# Patient Record
Sex: Female | Born: 1940
Health system: Southern US, Community
[De-identification: ages and names within clinical notes are randomized; demographics above are authoritative.]

## PROBLEM LIST (undated history)

## (undated) DIAGNOSIS — K219 Gastro-esophageal reflux disease without esophagitis: Secondary | ICD-10-CM

## (undated) DIAGNOSIS — F32A Depression, unspecified: Secondary | ICD-10-CM

## (undated) DIAGNOSIS — M1990 Unspecified osteoarthritis, unspecified site: Secondary | ICD-10-CM

## (undated) DIAGNOSIS — F329 Major depressive disorder, single episode, unspecified: Secondary | ICD-10-CM

## (undated) DIAGNOSIS — G43909 Migraine, unspecified, not intractable, without status migrainosus: Secondary | ICD-10-CM

## (undated) DIAGNOSIS — J309 Allergic rhinitis, unspecified: Secondary | ICD-10-CM

## (undated) DIAGNOSIS — H35379 Puckering of macula, unspecified eye: Secondary | ICD-10-CM

## (undated) DIAGNOSIS — K649 Unspecified hemorrhoids: Secondary | ICD-10-CM

## (undated) DIAGNOSIS — Z86010 Personal history of colonic polyps: Secondary | ICD-10-CM

## (undated) DIAGNOSIS — E039 Hypothyroidism, unspecified: Secondary | ICD-10-CM

## (undated) DIAGNOSIS — H269 Unspecified cataract: Secondary | ICD-10-CM

## (undated) DIAGNOSIS — L439 Lichen planus, unspecified: Secondary | ICD-10-CM

## (undated) HISTORY — DX: Unspecified osteoarthritis, unspecified site: M19.90

## (undated) HISTORY — PX: COLONOSCOPY: SHX174

## (undated) HISTORY — DX: Personal history of colonic polyps: Z86.010

## (undated) HISTORY — PX: THYROID SURGERY: SHX805

## (undated) HISTORY — DX: Hypothyroidism, unspecified: E03.9

## (undated) HISTORY — DX: Unspecified cataract: H26.9

## (undated) HISTORY — DX: Allergic rhinitis, unspecified: J30.9

## (undated) HISTORY — DX: Migraine, unspecified, not intractable, without status migrainosus: G43.909

## (undated) HISTORY — DX: Depression, unspecified: F32.A

## (undated) HISTORY — DX: Gastro-esophageal reflux disease without esophagitis: K21.9

## (undated) HISTORY — DX: Major depressive disorder, single episode, unspecified: F32.9

## (undated) HISTORY — DX: Puckering of macula, unspecified eye: H35.379

## (undated) HISTORY — DX: Lichen planus, unspecified: L43.9

## (undated) HISTORY — DX: Unspecified hemorrhoids: K64.9

## (undated) HISTORY — PX: OTHER SURGICAL HISTORY: SHX169

## (undated) HISTORY — PX: CATARACT EXTRACTION: SUR2

---

## 1999-02-23 ENCOUNTER — Other Ambulatory Visit: Admission: RE | Admit: 1999-02-23 | Discharge: 1999-02-23 | Payer: Self-pay | Admitting: *Deleted

## 1999-03-21 ENCOUNTER — Ambulatory Visit (HOSPITAL_COMMUNITY): Admission: RE | Admit: 1999-03-21 | Discharge: 1999-03-21 | Payer: Self-pay | Admitting: *Deleted

## 1999-03-21 ENCOUNTER — Encounter: Payer: Self-pay | Admitting: *Deleted

## 2000-10-25 ENCOUNTER — Encounter: Payer: Self-pay | Admitting: Family Medicine

## 2000-10-25 ENCOUNTER — Encounter: Admission: RE | Admit: 2000-10-25 | Discharge: 2000-10-25 | Payer: Self-pay | Admitting: Family Medicine

## 2001-10-28 ENCOUNTER — Other Ambulatory Visit: Admission: RE | Admit: 2001-10-28 | Discharge: 2001-10-28 | Payer: Self-pay | Admitting: Family Medicine

## 2002-04-30 ENCOUNTER — Encounter: Payer: Self-pay | Admitting: Family Medicine

## 2002-04-30 ENCOUNTER — Encounter: Admission: RE | Admit: 2002-04-30 | Discharge: 2002-04-30 | Payer: Self-pay | Admitting: Family Medicine

## 2005-02-22 ENCOUNTER — Encounter: Admission: RE | Admit: 2005-02-22 | Discharge: 2005-02-22 | Payer: Self-pay | Admitting: Family Medicine

## 2005-05-09 ENCOUNTER — Encounter (INDEPENDENT_AMBULATORY_CARE_PROVIDER_SITE_OTHER): Payer: Self-pay | Admitting: *Deleted

## 2005-05-09 ENCOUNTER — Ambulatory Visit (HOSPITAL_COMMUNITY): Admission: RE | Admit: 2005-05-09 | Discharge: 2005-05-09 | Payer: Self-pay | Admitting: Obstetrics and Gynecology

## 2005-11-02 ENCOUNTER — Encounter: Admission: RE | Admit: 2005-11-02 | Discharge: 2005-11-02 | Payer: Self-pay | Admitting: Family Medicine

## 2005-11-21 ENCOUNTER — Ambulatory Visit (HOSPITAL_COMMUNITY): Admission: RE | Admit: 2005-11-21 | Discharge: 2005-11-21 | Payer: Self-pay | Admitting: Ophthalmology

## 2008-05-07 ENCOUNTER — Encounter: Admission: RE | Admit: 2008-05-07 | Discharge: 2008-05-07 | Payer: Self-pay | Admitting: Family Medicine

## 2009-02-25 ENCOUNTER — Encounter: Payer: Self-pay | Admitting: Internal Medicine

## 2009-04-21 ENCOUNTER — Encounter: Payer: Self-pay | Admitting: Internal Medicine

## 2009-05-06 ENCOUNTER — Encounter: Payer: Self-pay | Admitting: Internal Medicine

## 2009-05-18 ENCOUNTER — Ambulatory Visit: Payer: Self-pay | Admitting: Internal Medicine

## 2009-06-01 ENCOUNTER — Ambulatory Visit: Payer: Self-pay | Admitting: Internal Medicine

## 2009-08-01 ENCOUNTER — Emergency Department (HOSPITAL_COMMUNITY): Admission: EM | Admit: 2009-08-01 | Discharge: 2009-08-01 | Payer: Self-pay | Admitting: Emergency Medicine

## 2011-05-12 NOTE — Op Note (Signed)
NAMESAYLAH, KETNER NO.:  [id number]   MEDICAL RECORD NO.:  [id number]          PATIENT TYPE:  AMB   LOCATION:  SDC                           FACILITY:  [facility]   PHYSICIAN:  Randye Lobo, M.D.   DATE OF BIRTH:  Apr 30, 1941   DATE OF PROCEDURE:  05/09/2005  DATE OF DISCHARGE:                                 OPERATIVE REPORT   PREOPERATIVE DIAGNOSES:  1.  Postmenopausal bleeding.  2.  Endometrial polyp.   POSTOPERATIVE DIAGNOSES:  1.  Postmenopausal bleeding.  2.  Endometrial polyp.   PROCEDURE:  Hysteroscopy, dilation and curettage.   SURGEON:  Conley Simmonds, MD   ANESTHESIA:  General, paracervical block with 1% lidocaine.   IV FLUIDS:  1000 mL Ringer's lactate.   ESTIMATED BLOOD LOSS:  Minimal.   URINE OUTPUT:  Per I&O catheterization prior to procedure.   COMPLICATIONS:  None.   INDICATIONS FOR SURGERY:  The patient is a 70 year old Caucasian female who  presented with a pelvic ultrasound showing a thickened endometrium near the  fundal region.  The remainder of the patient's pelvic ultrasound was  unremarkable.  During the course of her evaluation process, the patient did  present to the office with an episode of postmenopausal bleeding and  underwent an endometrial biopsy which documented an endometrial polyp.  The  patient was discontinued from a course of hormone therapy that had been  recently initiated.   The patient was diagnosed with an endometrial polyp, and a recommendation  was made to proceed with a hysteroscopy with dilation and curettage and  polypectomy after risks, benefits and alternatives were discussed with her.   FINDINGS:  Exam under anesthesia revealed a small, anteverted, mobile  uterus.  No adnexal masses were appreciated.   Hysteroscopy demonstrated a 4 mm of left fundal sessile endometrial polypoid  area.  There was also a 3-4 mm area along the anterior fundal wall which was  thought to be possibly consistent with a  small fibroid versus calcified  area.  No other lesions were appreciated within the uterine cavity or the  cervix.   SPECIMENS:  Endometrial curettings were sent to pathology.   PROCEDURE:  The patient was reidentified in the preoperative hold area and  was then taken to the operating room, where general endotracheal anesthesia  was induced.  The patient was placed in the dorsal lithotomy position and  the vagina and perineum were sterilely prepped and draped.  The bladder was  I&O catheterized.  An exam under anesthesia was performed.   The procedure began by placing a speculum inside the vagina and a tenaculum  on the anterior cervical lip.  A paracervical block was performed with 10 mL  of 1% lidocaine in standard fashion.  The cervix was dilated to a #19 Pratt  dilator.  The diagnostic hysteroscope was inserted under the continuous  infusion of sorbitol, and the findings were as noted above.  The  hysteroscope was withdrawn and the cervix was dilated slightly more, and a  combination of sharp and serrated curettes were then used to curette the  endometrial cavity  in all four quadrants.  The operative hysteroscope was  then placed through the cervix and into the endometrial cavity after the  cervix was dilated to a #25 Pratt dilator.  The visualization was not  adequate at this time, and this was therefore removed and the diagnostic  hysteroscope was reinserted again under the infusion of sorbitol.  There was  a small polypoid area which was still present along the patient's left  fundal wall, and the hysteroscope was therefore removed and the area was  again curetted in this area.  All specimens were sent to pathology, and  final hysteroscopy demonstrated the absence of endometrial polyps in the  uterine cavity at this time.   All of the instruments were removed, and hemostasis was noted to be good.  The patient was therefore awakened and extubated and taken out of the dorsal   lithotomy position.  She was escorted to the recovery room in stable and  awake condition.  There were no complications for the procedure.  All  needle, instrument, sponge counts were correct.       BES/MEDQ  D:  05/09/2005  T:  05/09/2005  Job:  [id number]

## 2011-05-12 NOTE — Op Note (Signed)
[name], [name] NO.:  [id number]   MEDICAL RECORD NO.:  [id number]          PATIENT TYPE:  AMB   LOCATION:  SDS                          FACILITY:  [facility]   PHYSICIAN:  [name], M.D.   DATE OF BIRTH:  Dec 20, 1941   DATE OF PROCEDURE:  11/21/2005  DATE OF DISCHARGE:                                 OPERATIVE REPORT   PREOPERATIVE DIAGNOSIS:  Epiretinal membrane, left eye, severe.   POSTOPERATIVE DIAGNOSES:  1.  Epiretinal membrane, left eye, severe.  2.  Extramacular branch retinal vein occlusion, left eye temporally.  3.  Retinal hole found temporal to the macula with peeling of two layers of      epiretinal membrane.   PROCEDURES:  1.  Posterior vitrectomy with membrane peel, left eye, epiretinal tissue as      well as internal limiting membrane, left eye.  2.  Pars plana vitrectomy with focal laser photocoagulation for retinal      hole, each 25-gauge.   SURGEON:  Molly Highland. Byrd, M.D.   ANESTHESIA:  Local retrobulbar with monitored anesthesia control.   INDICATION FOR PROCEDURE:  The patient is a 70 year old woman with profound  vision loss on the basis of epiretinal membrane and macular topographic  distortion of the left eye.  The patient understands this is an attempt to  release the macular traction so as to allow enhanced visual acuity and  improvement in visual acuity.  The patient understands the risks of  anesthesia including the rare occurrence of death and losses to the eye,  including but not limited to hemorrhage, infection, scarring, need for  further surgery, no change in vision, loss of vision, and progression of  disease despite intervention.  An appropriate signed consent was obtained.   The patient taken to the operating room.  In the operating room appropriate  monitoring was followed by mild sedation.  Marcaine 0.75% delivered 5 mL  retrobulbar followed by an additional 5 mL in the left eye in a modified [employer] fashion.   The left periocular region was sterilely prepped and draped  in the usual ophthalmic fashion.  Microscope placed in position.  Shelved  trocar incisions for 25-gauge vitrectomy were then placed in a biplanar  fashion.  The infusion was confirmed in the vitreous cavity.  The superior  trocar was placed in a similar fashion.  A 25-gauge core vitrectomy was then  begun.  Posterior hyaloid was identified with a spontaneous Weiss ring.  Peripheral vitreous was then trimmed 360 degrees.  Notable findings were of  areas of retinal nonperfusion at the 3 o'clock position in the far temporal  periphery consistent with an Extramacular branch retinal vein occlusion.  At  this time there were no retinal holes or tears peripherally.  The epiretinal  membrane was then grasped with a 25-gauge forceps.  Peeling of the first  retinal membrane created traction along the superotemporal macular arcade  and the are of thin retina with possible retinal hole was fashioned.  The  internal limiting membrane was then grasped in this area and this was  removed in a contiguous fashion off the foveal macular region.   At this time focal laser photocoagulation was placed around the apparent  retinal hole in the temporal macula as well as in the area of Extramacular  branch retinal vein occlusion temporally in the far temporal periphery.   At this time under high infusion pressures, the trocars were removed and  scleral compression was then used and, indeed, no egress of fluid into the  subconjunctival space was noted in all three quadrants.  Excellent pressure  was maintained.  Subconjunctival Decadron was applied.  A sterile patch and  Fox shield applied.  The patient taken to the short stay area, to be  discharged home as an outpatient.      [name], M.D.  Electronically Signed     GAR/MEDQ  D:  11/21/2005  T:  11/21/2005  Job:  [id number]   cc:   [name]. [name], M.D.  Fax: [phone]

## 2011-12-27 DIAGNOSIS — H1030 Unspecified acute conjunctivitis, unspecified eye: Secondary | ICD-10-CM | POA: Diagnosis not present

## 2011-12-27 DIAGNOSIS — H35379 Puckering of macula, unspecified eye: Secondary | ICD-10-CM | POA: Diagnosis not present

## 2012-01-18 DIAGNOSIS — H35379 Puckering of macula, unspecified eye: Secondary | ICD-10-CM | POA: Diagnosis not present

## 2012-01-19 DIAGNOSIS — L253 Unspecified contact dermatitis due to other chemical products: Secondary | ICD-10-CM | POA: Diagnosis not present

## 2012-01-19 DIAGNOSIS — L940 Localized scleroderma [morphea]: Secondary | ICD-10-CM | POA: Diagnosis not present

## 2012-02-22 DIAGNOSIS — Z2089 Contact with and (suspected) exposure to other communicable diseases: Secondary | ICD-10-CM | POA: Diagnosis not present

## 2012-02-22 DIAGNOSIS — Z0000 Encounter for general adult medical examination without abnormal findings: Secondary | ICD-10-CM | POA: Diagnosis not present

## 2012-03-21 DIAGNOSIS — H35379 Puckering of macula, unspecified eye: Secondary | ICD-10-CM | POA: Diagnosis not present

## 2012-03-21 DIAGNOSIS — H43819 Vitreous degeneration, unspecified eye: Secondary | ICD-10-CM | POA: Diagnosis not present

## 2012-03-21 DIAGNOSIS — H3581 Retinal edema: Secondary | ICD-10-CM | POA: Diagnosis not present

## 2012-04-03 ENCOUNTER — Encounter (HOSPITAL_COMMUNITY): Payer: Self-pay | Admitting: Surgery

## 2012-04-03 ENCOUNTER — Ambulatory Visit (HOSPITAL_COMMUNITY)
Admission: RE | Admit: 2012-04-03 | Discharge: 2012-04-03 | Disposition: A | Discharge status: Home / Self Care | Payer: Medicare Other | Source: Ambulatory Visit | Attending: Internal Medicine | Admitting: Internal Medicine

## 2012-04-03 ENCOUNTER — Other Ambulatory Visit (HOSPITAL_COMMUNITY): Payer: Self-pay | Admitting: Internal Medicine

## 2012-04-03 ENCOUNTER — Inpatient Hospital Stay (HOSPITAL_COMMUNITY)
Admission: AD | Admit: 2012-04-03 | Discharge: 2012-04-05 | DRG: 419 | Disposition: A | Discharge status: Home / Self Care | Payer: Medicare Other | Source: Ambulatory Visit | Attending: General Surgery | Admitting: General Surgery

## 2012-04-03 DIAGNOSIS — Z1231 Encounter for screening mammogram for malignant neoplasm of breast: Secondary | ICD-10-CM | POA: Diagnosis not present

## 2012-04-03 DIAGNOSIS — R109 Unspecified abdominal pain: Secondary | ICD-10-CM | POA: Diagnosis not present

## 2012-04-03 DIAGNOSIS — E039 Hypothyroidism, unspecified: Secondary | ICD-10-CM | POA: Diagnosis not present

## 2012-04-03 DIAGNOSIS — K8020 Calculus of gallbladder without cholecystitis without obstruction: Secondary | ICD-10-CM | POA: Diagnosis not present

## 2012-04-03 DIAGNOSIS — K8000 Calculus of gallbladder with acute cholecystitis without obstruction: Secondary | ICD-10-CM | POA: Diagnosis not present

## 2012-04-03 DIAGNOSIS — R82998 Other abnormal findings in urine: Secondary | ICD-10-CM | POA: Diagnosis not present

## 2012-04-03 DIAGNOSIS — K828 Other specified diseases of gallbladder: Secondary | ICD-10-CM | POA: Diagnosis not present

## 2012-04-03 DIAGNOSIS — R071 Chest pain on breathing: Secondary | ICD-10-CM | POA: Diagnosis not present

## 2012-04-03 DIAGNOSIS — J9819 Other pulmonary collapse: Secondary | ICD-10-CM | POA: Diagnosis not present

## 2012-04-03 DIAGNOSIS — K219 Gastro-esophageal reflux disease without esophagitis: Secondary | ICD-10-CM | POA: Diagnosis not present

## 2012-04-03 DIAGNOSIS — I2699 Other pulmonary embolism without acute cor pulmonale: Secondary | ICD-10-CM

## 2012-04-03 DIAGNOSIS — R079 Chest pain, unspecified: Secondary | ICD-10-CM | POA: Diagnosis not present

## 2012-04-03 DIAGNOSIS — R030 Elevated blood-pressure reading, without diagnosis of hypertension: Secondary | ICD-10-CM | POA: Diagnosis not present

## 2012-04-03 DIAGNOSIS — K8010 Calculus of gallbladder with chronic cholecystitis without obstruction: Secondary | ICD-10-CM | POA: Diagnosis not present

## 2012-04-03 DIAGNOSIS — K810 Acute cholecystitis: Secondary | ICD-10-CM | POA: Diagnosis not present

## 2012-04-03 DIAGNOSIS — R072 Precordial pain: Secondary | ICD-10-CM | POA: Diagnosis not present

## 2012-04-03 MED ORDER — SODIUM CHLORIDE 0.9 % IV SOLN
3.0000 g | Freq: Four times a day (QID) | INTRAVENOUS | Status: DC
  Administered 2012-04-04 (×4): 3 g via INTRAVENOUS
  Filled 2012-04-03 (×7): qty 3

## 2012-04-03 MED ORDER — HYDROMORPHONE HCL PF 1 MG/ML IJ SOLN
1.0000 mg | INTRAMUSCULAR | Status: DC | PRN
  Administered 2012-04-04: 1 mg via INTRAVENOUS
  Filled 2012-04-03: qty 1

## 2012-04-03 MED ORDER — ENOXAPARIN SODIUM 40 MG/0.4ML ~~LOC~~ SOLN
40.0000 mg | SUBCUTANEOUS | Status: DC
  Filled 2012-04-03: qty 0.4

## 2012-04-03 MED ORDER — KCL IN DEXTROSE-NACL 10-5-0.45 MEQ/L-%-% IV SOLN
INTRAVENOUS | Status: DC
  Administered 2012-04-04 – 2012-04-05 (×2): via INTRAVENOUS
  Filled 2012-04-03 (×7): qty 1000

## 2012-04-03 MED ORDER — IOHEXOL 350 MG/ML SOLN
100.0000 mL | Freq: Once | INTRAVENOUS | Status: AC | PRN
  Administered 2012-04-03: 100 mL via INTRAVENOUS

## 2012-04-03 MED ORDER — ONDANSETRON HCL 4 MG/2ML IJ SOLN: 4.0000 mg | Freq: Four times a day (QID) | INTRAMUSCULAR | Status: DC | PRN

## 2012-04-03 MED ORDER — IOHEXOL 300 MG/ML  SOLN
40.0000 mL | Freq: Once | INTRAMUSCULAR | Status: AC | PRN
  Administered 2012-04-03: 40 mL via ORAL

## 2012-04-03 NOTE — Progress Notes (Signed)
Called by Radiology regarding CT results consistent with Acute Cholecystitis- discussed with Dr. Luisa Hart ([facility]) who will admit patient to surgical service.  Below is Dr. Vicente Males note from office visit earlier today and lab results:  Vital Signs  Entered weight:  153.8  lbs., Calculated Weight: 153.80 lbs., ( 69.76 kg) Height: 64 in., ( 162.56 cm) Pulse rate: 100 Pulse rhythm: regular  Blood Pressure #1: 128 / 78 mm Hg    BMI: 26.40 Wt Chg: 1.80 lbs since 06/13/2011  Vitals entered by: Drema Pry CMA on April 03, 2012 3:30 PM  Pulse Oximetry  O2 Saturation: 94 %  Other Procedures Completed: EKG   Risk Factors:  Tobacco use:  never smoker Passive smoke exposure:  yes Drug use:  no HIV high-risk behavior:  no Caffeine use:  2 drinks per day Alcohol use:  no Exercise:  no Seatbelt use:  100 % Sun Exposure:  occasionally  Colonoscopy History:    Date of Last Colonoscopy:  06/01/2009  Mammogram History:    Date of Last Mammogram:  02/14/2011  PAP Smear History:    Date of Last PAP Smear:  06/13/2011  History of Present Illness  Reason for visit: See chief complaint Chief Complaint: pt c/o chest pressure the last three days, constant,  pt denies any shortness of breath,   History of Present Illness: Patient presents as a work and office visit with a three-day four-day history of worsening chest discomfort, without shortness of breath, states that however when she was trying to walk half a mile yesterday did have to stop secondary to discomfort. She has been off of her acid blockers, Prilosec, states that her discomfort is pleuritic, denies any fevers or chills, associates this discomfort and pain in the sternal area, radiating into the epigastric area and around to the right upper quadrant of the abdomen. Denies any significant nausea vomiting and has no effective eating with her discomfort. Denies any fevers, chills, changes in bowel habits, blood in her urine or stool, specifically  denies dysuria.   Review of Systems  General: No fevers, chills, general malaise, denies headaches, sweats, anorexia Eyes: Denies blurring, diplopia, irritation, discharge, vision loss, eye pain, photophobia.   Ear/Nose/Throat: denies ear pain or discharge, tinnitus, decreased hearing, nasal obstruction or discharge, nosebleeds, sore throat, hoarseness, dysphagia Cardiovascular: See HPI, substernal chest discomfort, pleuritic, with radiation to the epigastric area, right upper quadrant of the abdomen, without nausea, vomiting Respiratory: Denies any shortness of breath, but does admit to pleuritic discomfort, denies wheezing, cough, hemoptysis we'll sputum production Gastrointestinal: Denies nausea, vomiting, diarrhea, constipation or significant change in bowel habits Genitourinary: Denies dysuria, frequency or hematuria Musculoskeletal: Admits to arthritis and general myalgias as well as sternal pain as above Skin: Denies rash, itching Neurologic: Denies radicular symptoms, headaches Psychiatric: Spirits stable, denies any exacerbations Endocrine: Denies cold intolerance, heat intolerance, polydipsia, polyphagia, polyuria, weight change.   Hematologic/Lymphatic: Denies abnormal bruising, bleeding, enlarged lymph nodes.    Past History Past Medical History (reviewed - no changes required): Hypothyroidism status post remote thyroid surgery Previous physician Dr. Mosetta Putt Family history premature coronary artery and diabetes Hypothyroidism Migraine headaches Effexor XR 75 mg a day, for depression Lamisil 250 mg each day for nail fungus GYN: Dr. Duard Larsen  Cataracts Dr. Dagoberto Ligas retina Wrinkle with surgery by Dr. Luciana Axe GI: Dr Leone Payor Colonscopy 2010 "ok" DEXA 2011-nl lichen planus followed by Dr. Emily Filbert SAR Surgical History (reviewed - no changes required): History thyroid surgery 2000? cared for Dr. Evlyn Kanner; no  cancer Cataracts Dr. Dagoberto Ligas retina Wrinkle with surgery  by Dr. 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[employer], high school education, married (1988), no history of tobacco or alcohol use, walks 30 minutes each day; native of [place]   Physical Exam  General appearance: well nourished, well hydrated, no acute distress  Eyes  External: conjunctivae and lids normal Pupils: equal, round, reactive to light and accommodation  Ears, Nose and Throat  External ears: normal, no lesions or deformities External nose: normal, no lesions or deformities Otoscopic: canals clear, tympanic membranes intact, no fluid Hearing: grossly intact Nasal: mucosa, septum, and turbinates normal Dental: good dentition Pharynx: tongue normal, protrudes mid line,  posterior pharynx without erythema or exudate  Neck  Neck: supple, no masses, trachea midline Thyroid: no nodules, masses, tenderness, or enlargement  Respiratory  Respiratory effort: no intercostal retractions or use of accessory muscles Auscultation: no rales, rhonchi, or wheezes  Cardiovascular  Palpation: minimally tender to palpation over the sternal area but very tender as noted below in the epigastric area Auscultation: S1, S2, no murmur, rub, or gallop Carotid arteries: pulses 2+, symmetric, no bruits Abdominal aorta: no enlargement or bruits Pedal pulses: pulses 2+, symmetric Periph. circulation: no cyanosis, clubbing, edema  Gastrointestinal  Abdomen: nondistended, bowel sounds present, moderately tender in the epigastric area radiating over to the right upper quadrant and right mid section,  no rash Liver and spleen: no enlargement or nodularity  Lymphatic  Neck: no cervical adenopathy Axillae: no axillary adenopathy  Musculoskeletal  Gait and station: normal Digits and nails: no clubbing, cyanosis, petechiae, or nodes Head and neck: normal alignment and mobility Spine, ribs, pelvis: normal alignment and mobility, no deformity  Skin  Inspection: no rashes, lesions Palpation: no subcutaneous nodules or induration  Neurologic  Cranial nerves: II - XII grossly intact Reflexes: 2+, symmetric Sensation: intact to touch, vibration  Mental Status Exam  Judgment, insight: intact Orientation: oriented to time, place, and person Memory: intact Mood and affect: Normal Mood  Assessment and Plan  Problem # 1:      CHEST PAIN - PLEURITIC (ZOX-096.04) - New Problem   Unclear etiology, chest x-ray unremarkable, oxygen saturation initially 90% on room air increasing to 94% with time, exam unremarkable however patient is somewhat tender over the sternal area as well as the epigastric area, blood counts are normal with no evidence of leukocytosis or anemia, renal parameters and electrolytes as well as liver function tests are normal. Pleuritic discomfort as well as inability to walk long distances as she normally has is concerning, we do need to rule out pulmonary embolism. We'll obtain CT angiogram to rule this out. Please note that EKG and chest x-ray both unremarkable awaiting formal radiological over read.  Problem # 2:      ABDOMINAL PAIN (ICD-789.00) - New Problem   Significant abdominal pain in the epigastric area as well as right upper quadrant, laboratory evaluation unremarkable however with urinalysis there is a trace amount of leukocytes and 2+ blood however microscopic only shows 0-5 white blood cell counts, 5-10 red blood cells with occasional epithelial cells no bacteria. Unlikely to be a source of infection  especially given her normal white count in the location of her  pain. Patient is afebrile, and patient has been back on her omeprazole since Sunday, 3 days. She is taking the omeprazole twice a day. We will also add an abdominal and pelvic CT scan with p.o. and IV contrast to further evaluation and management. We'll try to obtain these tonight with call report.  Updated Orders:  EKG 12 Leads [CPT-93000] Chest X-Ray (Pa & L) [CPT-71020] Venipuncture [CPT-36415] CBC/Platelets [CPT-85025] CMET (SMA 12) [CPT-80053] UA (Autmated) W/Micro [CPT-81001] Ofc Vst, Est Level V [CPT-99215] CT Abdomen and Pelvis W/Contrast [CTAPW] CT Angiography Chest-Pulmonary Embolus W/Contrast [CTACPE]  Electronically signed by Hoyle Sauer MD on 04/03/2012 at 5:22 PM  Lab Results- Tests: (1) URINALYSIS (4000)   PH                        5.0                         5.0-9.0   SPECIFIC GRAVITY          >=1.030                     1.000 - 1.030 ! GLUCOSE                   NEGATIVE                    NEGATIVE   UROBILINOGEN              1.0 E.U./dL mg/dL           0.2 mg/dL   KETONES                   * TRACE mg/dl               NEGATIVE   BLOOD                     * 2+                        NEGATIVE   NITRATE                   NEGATIVE                    NEGATIVE   LEUKOCYTES                * TRACE                     NEGATIVE   PROTEIN                   NEGATIVE mg/dl              NEGATIVE   BILIRUBIN                 NEGATIVE                    NEGATIVE  Tests: (2) CBC (2000)   WBC                       7.70 K/uL                   4.10-10.90   LYM  2.5 K/uL                    0.6-4.1 ! MID                       0.9 K/uL                    0.0-1.8   GRAN                      4.3 K/uL                    2.0-7.8   LYM%                      32.5 %                      10.0-58.5 ! MID%                      11.1 %                      0.1-24.0   GRAN%                     56.4                        37.0-92.0   RBC                  [L]  4.1 M/uL                     4.2-6.3     l=   HGB                       13.2 g/dL                   40.9-81.1   HCT                       39.0 %                      37.0-51.0   MCV                       94.9 fL                     80.0-97.0   MCH                  [H]  32.1 pg                     26.0-32.0     h=   MCHC                      33.8 g/dL                   91.4-78.2   PLT                       269 K/uL                    140-440  Tests: (3) COMPLETE METABOLIC (1010)  GLUCOSE              [H]  132 mg/dl                   96-045   BUN                       11 mg/dl                    4-09   CREATININE                0.9 mg/dl                   8.1-1.9  eGFR Non-African American                             61.7  eGFR African American                             74.7   SODIUM                    140 mEq/L                   135-148   POTASSIUM                 3.5 mEq/L                   3.5-5.3   CHLORIDE                  105 mEq/L                   80-111   CO2                       26 mEq/L                    15-35   CALCIUM                   9.4 mg/dL                   1.4-78.2   TOTAL PROTEIN             7.2 g/dL                    9.5-6.2   ALBUMIN                   3.2 g/dL                    1.3-0.8   AST                       27 IU/L                     7-45   ALT                       15 IU/L                     5-40   ALK PHOS  89 IU/L                     37-137   TOTAL BILIRUBIN           0.4 mg/dl                   1.6-1.0  Tests: (4) URINE MICROSCOPIC (7100)   WBC URINE                 0-5                         NEGATIVE   RBC URINE                 5-10                        NEGATIVE   CRYSTALS                  NONE                        NEGATIVE ! BACTERIA                  NEGATIVE                    TRACE   EPITHELIAL CELLS          OCC                         NEGATIVE   YEAST                     NEGATIVE                    NEGATIVE   MUCUS                      NEGATIVE                    LIGHT   CASTS                     NONE                        NEGATIVE   OTHER                     NONE

## 2012-04-03 NOTE — H&P (Signed)
[name] is an 71 y.o. female.   Chief Complaint: Chest pain HPI: 4 day history of epigastric and RUQ abdominal pain with nausea.  Worsening and sharp without radiation.  No vomiting but nausea present.  Sent over by Dr Felipa Eth for CT which shows cholecystitis.  No previous attacks. No appetite.  No food trigger.  No past medical history on file.  No past surgical history on file.  No family history on file. Social History:  does not have a smoking history on file. She does not have any smokeless tobacco history on file. Her alcohol and drug histories not on file.  Allergies: Not on File  Medications Prior to Admission  Medication Dose Route Frequency Provider Last Rate Last Dose  . iohexol (OMNIPAQUE) 300 MG/ML solution 40 mL  40 mL Oral Once PRN Medication Radiologist, MD   40 mL at 04/03/12 2053  . iohexol (OMNIPAQUE) 350 MG/ML injection 100 mL  100 mL Intravenous Once PRN Medication Radiologist, MD   100 mL at 04/03/12 2053   No current outpatient prescriptions on file as of 04/03/2012.    No results found for this or any previous visit (from the past 48 hour(s)). Ct Angio Chest W/cm &/or Wo Cm  04/03/2012  *RADIOLOGY REPORT*  Clinical Data:  Mid sternal chest pain and nausea.  Mid to right- sided abdominal pain.  CT ANGIOGRAPHY CHEST CT ABDOMEN AND PELVIS WITH CONTRAST  Technique:  Multidetector CT imaging of the chest was performed using the standard protocol during bolus administration of intravenous contrast.  Multiplanar CT image reconstructions including MIPs were obtained to evaluate the vascular anatomy. Multidetector CT imaging of the abdomen and pelvis was performed using the standard protocol during bolus administration of intravenous contrast.  Contrast: 100 mL of Omnipaque 300 IV contrast  Comparison:  Pelvic ultrasound performed 02/22/2005  CTA CHEST  Findings:  There is no evidence of pulmonary embolus.  Mild bilateral dependent subsegmental atelectasis is noted.  The  lungs are otherwise clear.  There is no evidence of significant focal consolidation, pleural effusion or pneumothorax.  No masses are identified; no abnormal focal contrast enhancement is seen.  The mediastinum is unremarkable in appearance.  There is no evidence of mediastinal lymphadenopathy.  No pericardial effusion is seen.  The great vessels are unremarkable in appearance. Scattered calcification is noted along the aortic arch.  No axillary lymphadenopathy is seen.  The visualized portions of the thyroid gland are unremarkable in appearance.  Scattered calcifications are noted within the left breast, within normal limits.  No acute osseous abnormalities are seen.   Review of the MIP images confirms the above findings.  IMPRESSION:  1.  No evidence of pulmonary embolus. 2.  Mild bilateral dependent subsegmental atelectasis noted; lungs otherwise clear.  CT ABDOMEN AND PELVIS  Findings: There is diffuse gallbladder wall thickening and a small amount of associated pericholecystic fluid, with surrounding soft tissue inflammation, compatible with acute cholecystitis. Scattered stones are seen at the fundus of the gallbladder, and a 9 mm stone is also noted at the gallbladder neck.  The gallbladder is mildly distended.  A 1.1 cm vague hypodensity within the left hepatic lobe is nonspecific in appearance; this could be further assessed on dynamic liver protocol MRI or CT, as deemed clinically appropriate. The liver is otherwise unremarkable in appearance.  The spleen is within normal limits.  The pancreas and adrenal glands are unremarkable.  The common hepatic duct and common bile duct remains grossly normal in caliber.  The kidneys are unremarkable in appearance.  There is no evidence of hydronephrosis.  No renal or ureteral stones are seen.  No perinephric stranding is appreciated.  The small bowel is unremarkable in appearance.  The stomach is within normal limits.  No acute vascular abnormalities are seen.  The  appendix is normal in caliber, without evidence for appendicitis.  Contrast progresses to the level of the descending colon; the colon is unremarkable in appearance.  The bladder is mildly distended and grossly unremarkable in appearance.  The uterus is within normal limits.  The ovaries are relatively symmetric; no suspicious adnexal masses are seen.  No inguinal lymphadenopathy is seen.  No acute osseous abnormalities are identified.  Mild vacuum phenomenon is noted at L3-L4.  Mild facet disease is noted along the lumbar spine.  Review of the MIP images confirms the above findings.  IMPRESSION:  1.  Acute cholecystitis, with diffuse gallbladder wall thickening and associated pericholecystic fluid, and surrounding soft tissue inflammation.  Scattered stones noted at the fundus of the gallbladder, and a 9 mm stone noted lodged at the gallbladder neck. 2.  No evidence of distal obstruction; the common hepatic duct and common bile duct remains normal in caliber. 3.  1.1 cm vague hypodensity within the left hepatic lobe, nonspecific in appearance; this could be further assessed on dynamic liver protocol MRI or CT, on an elective basis, when and as deemed clinically appropriate.  These results were called by telephone on 04/03/2012  at  09:13 p.m. to  the physician on call for Dr. Felipa Eth, who verbally acknowledged these results.  Original Report Authenticated By: Tonia Ghent, M.D.   Ct Abdomen Pelvis W Contrast  04/03/2012  *RADIOLOGY REPORT*  Clinical Data:  Mid sternal chest pain and nausea.  Mid to right- sided abdominal pain.  CT ANGIOGRAPHY CHEST CT ABDOMEN AND PELVIS WITH CONTRAST  Technique:  Multidetector CT imaging of the chest was performed using the standard protocol during bolus administration of intravenous contrast.  Multiplanar CT image reconstructions including MIPs were obtained to evaluate the vascular anatomy. Multidetector CT imaging of the abdomen and pelvis was performed using the standard  protocol during bolus administration of intravenous contrast.  Contrast: 100 mL of Omnipaque 300 IV contrast  Comparison:  Pelvic ultrasound performed 02/22/2005  CTA CHEST  Findings:  There is no evidence of pulmonary embolus.  Mild bilateral dependent subsegmental atelectasis is noted.  The lungs are otherwise clear.  There is no evidence of significant focal consolidation, pleural effusion or pneumothorax.  No masses are identified; no abnormal focal contrast enhancement is seen.  The mediastinum is unremarkable in appearance.  There is no evidence of mediastinal lymphadenopathy.  No pericardial effusion is seen.  The great vessels are unremarkable in appearance. Scattered calcification is noted along the aortic arch.  No axillary lymphadenopathy is seen.  The visualized portions of the thyroid gland are unremarkable in appearance.  Scattered calcifications are noted within the left breast, within normal limits.  No acute osseous abnormalities are seen.   Review of the MIP images confirms the above findings.  IMPRESSION:  1.  No evidence of pulmonary embolus. 2.  Mild bilateral dependent subsegmental atelectasis noted; lungs otherwise clear.  CT ABDOMEN AND PELVIS  Findings: There is diffuse gallbladder wall thickening and a small amount of associated pericholecystic fluid, with surrounding soft tissue inflammation, compatible with acute cholecystitis. Scattered stones are seen at the fundus of the gallbladder, and a 9 mm stone is also noted at the  gallbladder neck.  The gallbladder is mildly distended.  A 1.1 cm vague hypodensity within the left hepatic lobe is nonspecific in appearance; this could be further assessed on dynamic liver protocol MRI or CT, as deemed clinically appropriate. The liver is otherwise unremarkable in appearance.  The spleen is within normal limits.  The pancreas and adrenal glands are unremarkable.  The common hepatic duct and common bile duct remains grossly normal in caliber.  The  kidneys are unremarkable in appearance.  There is no evidence of hydronephrosis.  No renal or ureteral stones are seen.  No perinephric stranding is appreciated.  The small bowel is unremarkable in appearance.  The stomach is within normal limits.  No acute vascular abnormalities are seen.  The appendix is normal in caliber, without evidence for appendicitis.  Contrast progresses to the level of the descending colon; the colon is unremarkable in appearance.  The bladder is mildly distended and grossly unremarkable in appearance.  The uterus is within normal limits.  The ovaries are relatively symmetric; no suspicious adnexal masses are seen.  No inguinal lymphadenopathy is seen.  No acute osseous abnormalities are identified.  Mild vacuum phenomenon is noted at L3-L4.  Mild facet disease is noted along the lumbar spine.  Review of the MIP images confirms the above findings.  IMPRESSION:  1.  Acute cholecystitis, with diffuse gallbladder wall thickening and associated pericholecystic fluid, and surrounding soft tissue inflammation.  Scattered stones noted at the fundus of the gallbladder, and a 9 mm stone noted lodged at the gallbladder neck. 2.  No evidence of distal obstruction; the common hepatic duct and common bile duct remains normal in caliber. 3.  1.1 cm vague hypodensity within the left hepatic lobe, nonspecific in appearance; this could be further assessed on dynamic liver protocol MRI or CT, on an elective basis, when and as deemed clinically appropriate.  These results were called by telephone on 04/03/2012  at  09:13 p.m. to  the physician on call for Dr. Felipa Eth, who verbally acknowledged these results.  Original Report Authenticated By: Tonia Ghent, M.D.    Review of Systems  Constitutional: Negative for fever and chills.  HENT: Negative.   Eyes: Negative.   Respiratory: Negative.   Cardiovascular: Negative.   Gastrointestinal: Negative.   Genitourinary: Negative.   Musculoskeletal: Negative.    Skin: Negative.   Neurological: Negative.   Endo/Heme/Allergies: Negative.   Psychiatric/Behavioral: Negative.     Blood pressure 127/70, pulse 92, temperature 98.4 F (36.9 C), temperature source Oral, resp. rate 20, height 5\' 4"  (1.626 m), weight 152 lb 5.4 oz (69.1 kg), SpO2 96.00%. Physical Exam  Constitutional: She is oriented to person, place, and time. She appears well-developed and well-nourished.  HENT:  Head: Atraumatic.  Eyes: EOM are normal. Pupils are equal, round, and reactive to light.  Neck: Normal range of motion. Neck supple. No JVD present.  Cardiovascular: Normal rate and regular rhythm.   Respiratory: Effort normal and breath sounds normal.  GI: She exhibits no distension. There is tenderness in the right upper quadrant. There is positive Murphy's sign. There is no CVA tenderness.  Neurological: She is alert and oriented to person, place, and time. GCS eye subscore is 4. GCS verbal subscore is 5. GCS motor subscore is 6.  Skin: Skin is warm, dry and intact.  Psychiatric: She has a normal mood and affect. Her speech is normal and behavior is normal. Thought content normal.     Assessment/Plan Acute cholecystitis. IVF Pain control IV  antibiotics Probable Lap chole/open chole Thursday per Dr Janee Morn.  Cosby Proby A. 04/03/2012, 10:42 PM

## 2012-04-04 ENCOUNTER — Inpatient Hospital Stay (HOSPITAL_COMMUNITY): Payer: Medicare Other | Admitting: Anesthesiology

## 2012-04-04 ENCOUNTER — Inpatient Hospital Stay (HOSPITAL_COMMUNITY): Payer: Medicare Other

## 2012-04-04 ENCOUNTER — Encounter (HOSPITAL_COMMUNITY): Payer: Self-pay | Admitting: Surgery

## 2012-04-04 ENCOUNTER — Encounter (HOSPITAL_COMMUNITY): Payer: Self-pay | Admitting: Anesthesiology

## 2012-04-04 ENCOUNTER — Encounter (HOSPITAL_COMMUNITY): Admission: AD | Disposition: A | Discharge status: Home / Self Care | Payer: Self-pay | Source: Ambulatory Visit

## 2012-04-04 DIAGNOSIS — K8000 Calculus of gallbladder with acute cholecystitis without obstruction: Secondary | ICD-10-CM | POA: Diagnosis not present

## 2012-04-04 HISTORY — PX: CHOLECYSTECTOMY: SHX55

## 2012-04-04 LAB — CBC
HCT: 36.1 % (ref 36.0–46.0)
MCHC: 32.7 g/dL (ref 30.0–36.0)
MCV: 93.8 fL (ref 78.0–100.0)
RDW: 12.7 % (ref 11.5–15.5)

## 2012-04-04 LAB — COMPREHENSIVE METABOLIC PANEL
Albumin: 2.9 g/dL — ABNORMAL LOW (ref 3.5–5.2)
CO2: 29 mEq/L (ref 19–32)
Calcium: 8.9 mg/dL (ref 8.4–10.5)
Chloride: 101 mEq/L (ref 96–112)
Creatinine, Ser: 0.79 mg/dL (ref 0.50–1.10)
GFR calc non Af Amer: 82 mL/min — ABNORMAL LOW (ref >90)
Potassium: 3.9 mEq/L (ref 3.5–5.1)
Sodium: 139 mEq/L (ref 135–145)

## 2012-04-04 LAB — SURGICAL PCR SCREEN
MRSA, PCR: NEGATIVE
Staphylococcus aureus: NEGATIVE

## 2012-04-04 SURGERY — LAPAROSCOPIC CHOLECYSTECTOMY WITH INTRAOPERATIVE CHOLANGIOGRAM
Anesthesia: General | Site: Abdomen | Wound class: Clean Contaminated

## 2012-04-04 MED ORDER — 0.9 % SODIUM CHLORIDE (POUR BTL) OPTIME
TOPICAL | Status: DC | PRN
  Administered 2012-04-04: 1000 mL

## 2012-04-04 MED ORDER — MIDAZOLAM HCL 5 MG/5ML IJ SOLN
INTRAMUSCULAR | Status: DC | PRN
  Administered 2012-04-04 (×2): 1 mg via INTRAVENOUS

## 2012-04-04 MED ORDER — VENLAFAXINE HCL 75 MG PO TABS
75.0000 mg | ORAL_TABLET | Freq: Every day | ORAL | Status: DC
  Administered 2012-04-05: 75 mg via ORAL
  Filled 2012-04-04 (×2): qty 1

## 2012-04-04 MED ORDER — PROPOFOL 10 MG/ML IV EMUL
INTRAVENOUS | Status: DC | PRN
  Administered 2012-04-04: 120 ug/kg/min via INTRAVENOUS

## 2012-04-04 MED ORDER — HYDROMORPHONE HCL PF 1 MG/ML IJ SOLN: 0.2500 mg | INTRAMUSCULAR | Status: DC | PRN

## 2012-04-04 MED ORDER — BUPIVACAINE-EPINEPHRINE 0.25% -1:200000 IJ SOLN
INTRAMUSCULAR | Status: DC | PRN
  Administered 2012-04-04: 14 mL

## 2012-04-04 MED ORDER — PANTOPRAZOLE SODIUM 40 MG PO TBEC: 40.0000 mg | DELAYED_RELEASE_TABLET | Freq: Every day | ORAL | Status: DC

## 2012-04-04 MED ORDER — LEVOTHYROXINE SODIUM 112 MCG PO TABS
56.0000 ug | ORAL_TABLET | Freq: Every day | ORAL | Status: DC
  Administered 2012-04-05: 56 ug via ORAL
  Filled 2012-04-04 (×2): qty 0.5

## 2012-04-04 MED ORDER — LACTATED RINGERS IV SOLN
INTRAVENOUS | Status: DC | PRN
  Administered 2012-04-04 (×2): via INTRAVENOUS

## 2012-04-04 MED ORDER — ONDANSETRON HCL 4 MG/2ML IJ SOLN: 4.0000 mg | Freq: Four times a day (QID) | INTRAMUSCULAR | Status: DC | PRN

## 2012-04-04 MED ORDER — SCOPOLAMINE 1 MG/3DAYS TD PT72
1.0000 | MEDICATED_PATCH | TRANSDERMAL | Status: DC
  Administered 2012-04-04: 1.5 mg via TRANSDERMAL

## 2012-04-04 MED ORDER — LACTATED RINGERS IV SOLN
INTRAVENOUS | Status: DC
  Administered 2012-04-04: 14:00:00 via INTRAVENOUS

## 2012-04-04 MED ORDER — DEXAMETHASONE SODIUM PHOSPHATE 4 MG/ML IJ SOLN
INTRAMUSCULAR | Status: DC | PRN
  Administered 2012-04-04: 4 mg via INTRAVENOUS

## 2012-04-04 MED ORDER — OXYCODONE-ACETAMINOPHEN 5-325 MG PO TABS: 1.0000 | ORAL_TABLET | ORAL | Status: DC | PRN

## 2012-04-04 MED ORDER — ROCURONIUM BROMIDE 100 MG/10ML IV SOLN
INTRAVENOUS | Status: DC | PRN
  Administered 2012-04-04: 50 mg via INTRAVENOUS

## 2012-04-04 MED ORDER — FENTANYL CITRATE 0.05 MG/ML IJ SOLN
INTRAMUSCULAR | Status: DC | PRN
  Administered 2012-04-04: 100 ug via INTRAVENOUS
  Administered 2012-04-04 (×2): 50 ug via INTRAVENOUS
  Administered 2012-04-04: 25 ug via INTRAVENOUS
  Administered 2012-04-04: 100 ug via INTRAVENOUS
  Administered 2012-04-04 (×2): 50 ug via INTRAVENOUS
  Administered 2012-04-04: 25 ug via INTRAVENOUS
  Administered 2012-04-04: 50 ug via INTRAVENOUS

## 2012-04-04 MED ORDER — GLYCOPYRROLATE 0.2 MG/ML IJ SOLN
INTRAMUSCULAR | Status: DC | PRN
  Administered 2012-04-04: .7 mg via INTRAVENOUS

## 2012-04-04 MED ORDER — NEOSTIGMINE METHYLSULFATE 1 MG/ML IJ SOLN
INTRAMUSCULAR | Status: DC | PRN
  Administered 2012-04-04: 4 mg via INTRAVENOUS

## 2012-04-04 MED ORDER — PHENYLEPHRINE HCL 10 MG/ML IJ SOLN
INTRAMUSCULAR | Status: DC | PRN
  Administered 2012-04-04: 40 ug via INTRAVENOUS
  Administered 2012-04-04: 80 ug via INTRAVENOUS

## 2012-04-04 MED ORDER — SODIUM CHLORIDE 0.9 % IV SOLN
INTRAVENOUS | Status: DC | PRN
  Administered 2012-04-04: 16:00:00

## 2012-04-04 MED ORDER — ONDANSETRON HCL 4 MG/2ML IJ SOLN
INTRAMUSCULAR | Status: DC | PRN
  Administered 2012-04-04: 4 mg via INTRAVENOUS

## 2012-04-04 MED ORDER — SODIUM CHLORIDE 0.9 % IR SOLN
Status: DC | PRN
  Administered 2012-04-04 (×3): 1000 mL

## 2012-04-04 MED ORDER — PROPOFOL 10 MG/ML IV EMUL
INTRAVENOUS | Status: DC | PRN
  Administered 2012-04-04: 150 mg via INTRAVENOUS

## 2012-04-04 SURGICAL SUPPLY — 48 items
ADH SKN CLS APL DERMABOND .7 (GAUZE/BANDAGES/DRESSINGS) ×3
APPLIER CLIP 5 13 M/L LIGAMAX5 (MISCELLANEOUS) ×2
APPLIER CLIP ROT 10 11.4 M/L (STAPLE)
APR CLP MED LRG 11.4X10 (STAPLE)
APR CLP MED LRG 5 ANG JAW (MISCELLANEOUS) ×1
BAG SPEC RTRVL LRG 6X4 10 (ENDOMECHANICALS) ×1
BLADE SURG ROTATE 9660 (MISCELLANEOUS) IMPLANT
CANISTER SUCTION 2500CC (MISCELLANEOUS) ×2 IMPLANT
CHLORAPREP W/TINT 26ML (MISCELLANEOUS) ×2 IMPLANT
CLIP APPLIE 5 13 M/L LIGAMAX5 (MISCELLANEOUS) ×1 IMPLANT
CLIP APPLIE ROT 10 11.4 M/L (STAPLE) IMPLANT
CLOTH BEACON ORANGE TIMEOUT ST (SAFETY) ×2 IMPLANT
COVER MAYO STAND STRL (DRAPES) ×2 IMPLANT
COVER SURGICAL LIGHT HANDLE (MISCELLANEOUS) ×2 IMPLANT
DECANTER SPIKE VIAL GLASS SM (MISCELLANEOUS) ×4 IMPLANT
DERMABOND ADVANCED (GAUZE/BANDAGES/DRESSINGS) ×3
DERMABOND ADVANCED .7 DNX12 (GAUZE/BANDAGES/DRESSINGS) ×1 IMPLANT
DRAPE C-ARM 42X72 X-RAY (DRAPES) ×2 IMPLANT
DRAPE UTILITY 15X26 W/TAPE STR (DRAPE) ×4 IMPLANT
ELECT REM PT RETURN 9FT ADLT (ELECTROSURGICAL) ×2
ELECTRODE REM PT RTRN 9FT ADLT (ELECTROSURGICAL) ×1 IMPLANT
FILTER SMOKE EVAC LAPAROSHD (FILTER) ×1 IMPLANT
GLOVE BIO SURGEON STRL SZ8 (GLOVE) ×2 IMPLANT
GLOVE BIOGEL PI IND STRL 6.5 (GLOVE) IMPLANT
GLOVE BIOGEL PI IND STRL 8 (GLOVE) ×1 IMPLANT
GLOVE BIOGEL PI INDICATOR 6.5 (GLOVE) ×1
GLOVE BIOGEL PI INDICATOR 8 (GLOVE) ×1
GLOVE ECLIPSE 6.5 STRL STRAW (GLOVE) ×1 IMPLANT
GOWN PREVENTION PLUS XLARGE (GOWN DISPOSABLE) ×2 IMPLANT
GOWN STRL NON-REIN LRG LVL3 (GOWN DISPOSABLE) ×5 IMPLANT
KIT BASIN OR (CUSTOM PROCEDURE TRAY) ×2 IMPLANT
KIT ROOM TURNOVER OR (KITS) ×2 IMPLANT
NS IRRIG 1000ML POUR BTL (IV SOLUTION) ×2 IMPLANT
PAD ARMBOARD 7.5X6 YLW CONV (MISCELLANEOUS) ×4 IMPLANT
POUCH SPECIMEN RETRIEVAL 10MM (ENDOMECHANICALS) ×2 IMPLANT
SCISSORS LAP 5X35 DISP (ENDOMECHANICALS) ×1 IMPLANT
SET CHOLANGIOGRAPH 5 50 .035 (SET/KITS/TRAYS/PACK) ×2 IMPLANT
SET IRRIG TUBING LAPAROSCOPIC (IRRIGATION / IRRIGATOR) ×2 IMPLANT
SLEEVE Z-THREAD 5X100MM (TROCAR) ×4 IMPLANT
SPECIMEN JAR SMALL (MISCELLANEOUS) ×2 IMPLANT
SUT VIC AB 4-0 PS2 27 (SUTURE) ×2 IMPLANT
TOWEL OR 17X24 6PK STRL BLUE (TOWEL DISPOSABLE) ×2 IMPLANT
TOWEL OR 17X26 10 PK STRL BLUE (TOWEL DISPOSABLE) ×2 IMPLANT
TRAY LAPAROSCOPIC (CUSTOM PROCEDURE TRAY) ×2 IMPLANT
TROCAR HASSON GELL 12X100 (TROCAR) ×2 IMPLANT
TROCAR Z-THREAD FIOS 11X100 BL (TROCAR) IMPLANT
TROCAR Z-THREAD FIOS 5X100MM (TROCAR) ×2 IMPLANT
WATER STERILE IRR 1000ML POUR (IV SOLUTION) IMPLANT

## 2012-04-04 NOTE — Anesthesia Postprocedure Evaluation (Signed)
Anesthesia Post Note  Patient: [name]  Procedure(s) Performed: Procedure(s) (LRB): LAPAROSCOPIC CHOLECYSTECTOMY WITH INTRAOPERATIVE CHOLANGIOGRAM (N/A)  Anesthesia type: General  Patient location: PACU  Post pain: Pain level controlled and Adequate analgesia  Post assessment: Post-op Vital signs reviewed, Patient's Cardiovascular Status Stable, Respiratory Function Stable, Patent Airway and Pain level controlled  Last Vitals:  Filed Vitals:   04/04/12 1632  BP:   Pulse:   Temp: 36.5 C  Resp:     Post vital signs: Reviewed and stable  Level of consciousness: awake, alert  and oriented  Complications: No apparent anesthesia complications

## 2012-04-04 NOTE — Interval H&P Note (Signed)
History and Physical Interval Note: Please also see my progress note from today 04/04/2012 8:42 AM  [name]  has presented today for surgery, with the diagnosis of Cholecystitis with cholelithiasis [574.10]  The various methods of treatment have been discussed with the patient and family. After consideration of risks, benefits and other options for treatment, the patient has consented to  Procedure(s) (LRB): LAPAROSCOPIC CHOLECYSTECTOMY WITH INTRAOPERATIVE CHOLANGIOGRAM (N/A) as a surgical intervention .  The patients' history has been reviewed, patient re-examined, no change in status, stable for surgery.  I have reviewed the patients' chart and labs.  Questions were answered to the patient's satisfaction.     Shambhavi Salley E

## 2012-04-04 NOTE — Preoperative (Signed)
Beta Blockers   Reason not to administer Beta Blockers:Not Applicable 

## 2012-04-04 NOTE — Transfer of Care (Signed)
Immediate Anesthesia Transfer of Care Note  Patient: [name]  Procedure(s) Performed: Procedure(s) (LRB): LAPAROSCOPIC CHOLECYSTECTOMY WITH INTRAOPERATIVE CHOLANGIOGRAM (N/A)  Patient Location: PACU  Anesthesia Type: General  Level of Consciousness: oriented and sedated  Airway & Oxygen Therapy: Patient Spontanous Breathing and Patient connected to nasal cannula oxygen  Post-op Assessment: Report given to PACU RN and Post -op Vital signs reviewed and stable  Post vital signs: stable  Complications: No apparent anesthesia complications

## 2012-04-04 NOTE — Op Note (Signed)
04/03/2012 - 04/04/2012  4:03 PM  PATIENT:  [name]  71 y.o. female  PRE-OPERATIVE DIAGNOSIS:  Cholecystitis with cholelithiasis [574.10]  POST-OPERATIVE DIAGNOSIS:  Cholecystitis with cholelithiasis   PROCEDURE:  Procedure(s): LAPAROSCOPIC CHOLECYSTECTOMY WITH INTRAOPERATIVE CHOLANGIOGRAM  SURGEON:  Surgeon(s): Liz Malady, MD Cherylynn Ridges, MD  PHYSICIAN ASSISTANT:   ASSISTANTS: Jimmye Norman.MD    ANESTHESIA:   general  EBL:  Total I/O In: 1813.3 [I.V.:1813.3] Out: -   BLOOD ADMINISTERED:none  DRAINS: none   SPECIMEN:  Excision  DISPOSITION OF SPECIMEN:  PATHOLOGY  COUNTS:  YES  DICTATION: .Dragon Dictation patient was admitted with acute cholecystitis. She was identified in the preop holding area. She is on intravenous antibiotic regimen. Informed consent was obtained. She was brought to the operating room and general endotracheal and general anesthesia was administered by the anesthesia staff. Her abdomen was prepped and draped in sterile fashion. We did time out procedure. Infraumbilical region was infiltrated with quarter percent Marcaine with epinephrine. Infraumbilical incision was made. Subcutaneous tissues were dissected down revealing the anterior fascia. This was divided sharply along the midline the peritoneal  cavity was entered under direct vision. A 0 Vicryl pursestring suture was placed on the fascial opening. The [facility] trocar was inserted into the abdomen. The abdomen was insufflated with carbon dioxide in standard fashion. Under direct vision a 5 mm epigastric and 2 5 mm right lateral ports were placed. Local anesthetic was used at all port site. Laparoscopic exploration revealed a tensely distended gallbladder. This was drained with the nazhat drainage system. This revealed clear bile consistent with hydrops. Once the gallbladder was decompressed, the dome was grasped and several omental adhesions were dissected away. This went away fairly easily  revealing the body of the gallbladder. More dense adhesions were present there the swept away down revealing the infundibulum. The dome the gallbladder was retracted superior medially the infundibulum was retracted inferolaterally. Dissection began laterally and progressed medially. This easily identified a relatively large cystic duct. In addition it was noted the patient had a replaced right hepatic artery. The cystic duct arose rectum is apparent right hepatic artery right next to the gallbladder wall. Attention was redirected to the cystic duct. Dissection was completed until critical view was obtained. Excellent visualization was seen with a window created between the cystic duct the infundibulum and the liver. Clip was placed on the infundibular cystic duct junction. A small nick was made in the cystic duct. Cook cholangiocatheter was inserted. Intraoperative cholangiogram revealed no common bile duct filling defects and good flow of contrast into the duodenum. Cholangiocatheter was removed. 3 clips were placed proximal the cystic duct and it was divided. The cystic duct was dissected free off the replaced right hepatic artery. It was clipped twice proximally once distally and divided. The gallbladder was taken off the liver bed using Bovie cautery. Excellent hemostasis was obtained. The gallbladder was placed in Endo Catch bag and removed from the abdomen via the infraumbilical site. Abdomen was irrigated. Irrigation fluid returned clear. Liver bed was dry. Clips were in good position. Irrigation fluid was evacuated. Ports were removed under direct vision. Pneumoperitoneum was released. Infraumbilical fascia was closed by tying the 0 Vicryl pursestring suture. Care was taken not to trap the intraconal contents. All 4 wounds were copiously irrigated. Skin of the epigastric and infraumbilical port site were closed with running 4 Vicryl subcuticular stitch. All 4 wounds were closed with Dermabond. Sponge needle  and as we counts were correct. Patient tolerated  procedure well without apparent palpitations taken recovery in stable condition.  PATIENT DISPOSITION:  PACU - hemodynamically stable.   Delay start of Pharmacological VTE agent (>24hrs) due to surgical blood loss or risk of bleeding:  no  Violeta Gelinas, MD, MPH, FACS Pager: [phone]  4/11/20134:03 PM

## 2012-04-04 NOTE — Anesthesia Preprocedure Evaluation (Signed)
Anesthesia Evaluation  Patient identified by MRN, date of birth, ID band Patient awake    Reviewed: Allergy & Precautions, H&P , NPO status , Patient's Chart, lab work & pertinent test results  Airway Mallampati: II  Neck ROM: full    Dental   Pulmonary          Cardiovascular     Neuro/Psych  Headaches,    GI/Hepatic GERD-  ,  Endo/Other  Hypothyroidism   Renal/GU      Musculoskeletal   Abdominal   Peds  Hematology   Anesthesia Other Findings   Reproductive/Obstetrics                           Anesthesia Physical Anesthesia Plan  ASA: II  Anesthesia Plan: General   Post-op Pain Management:    Induction: Intravenous  Airway Management Planned: Oral ETT  Additional Equipment:   Intra-op Plan:   Post-operative Plan: Extubation in OR  Informed Consent: I have reviewed the patients History and Physical, chart, labs and discussed the procedure including the risks, benefits and alternatives for the proposed anesthesia with the patient or authorized representative who has indicated his/her understanding and acceptance.     Plan Discussed with: CRNA and Surgeon  Anesthesia Plan Comments:         Anesthesia Quick Evaluation

## 2012-04-04 NOTE — Progress Notes (Signed)
Subjective: Pain somewhat improved  Objective: Vital signs in last 24 hours: Temp:  [97.6 F (36.4 C)-98.4 F (36.9 C)] 97.6 F (36.4 C) (04/11 0556) Pulse Rate:  [77-92] 77  (04/11 0556) Resp:  [18-20] 18  (04/11 0556) BP: (112-127)/(59-70) 113/59 mmHg (04/11 0556) SpO2:  [93 %-96 %] 95 % (04/11 0556) Weight:  [69.1 kg (152 lb 5.4 oz)] 69.1 kg (152 lb 5.4 oz) (04/10 2231) Last BM Date: 04/01/12  Intake/Output from previous day: 04/10 0701 - 04/11 0700 In: 529.7 [I.V.:529.7] Out: 850 [Urine:850] Intake/Output this shift:    General appearance: alert and cooperative Resp: clear to auscultation bilaterally Cardio: regular rate and rhythm GI: Soft, tender RUQ with vague mass palpable  Lab Results:   [facility] 04/04/12 0635  WBC 5.7  HGB 11.8*  HCT 36.1  PLT 240   BMET  Basename 04/04/12 0635  NA 139  K 3.9  CL 101  CO2 29  GLUCOSE 106*  BUN 7  CREATININE 0.79  CALCIUM 8.9   PT/INR No results found for this basename: LABPROT:2,INR:2 in the last 72 hours ABG No results found for this basename: PHART:2,PCO2:2,PO2:2,HCO3:2 in the last 72 hours  Studies/Results: Ct Angio Chest W/cm &/or Wo Cm  04/03/2012  *RADIOLOGY REPORT*  Clinical Data:  Mid sternal chest pain and nausea.  Mid to right- sided abdominal pain.  CT ANGIOGRAPHY CHEST CT ABDOMEN AND PELVIS WITH CONTRAST  Technique:  Multidetector CT imaging of the chest was performed using the standard protocol during bolus administration of intravenous contrast.  Multiplanar CT image reconstructions including MIPs were obtained to evaluate the vascular anatomy. Multidetector CT imaging of the abdomen and pelvis was performed using the standard protocol during bolus administration of intravenous contrast.  Contrast: 100 mL of Omnipaque 300 IV contrast  Comparison:  Pelvic ultrasound performed 02/22/2005  CTA CHEST  Findings:  There is no evidence of pulmonary embolus.  Mild bilateral dependent subsegmental atelectasis  is noted.  The lungs are otherwise clear.  There is no evidence of significant focal consolidation, pleural effusion or pneumothorax.  No masses are identified; no abnormal focal contrast enhancement is seen.  The mediastinum is unremarkable in appearance.  There is no evidence of mediastinal lymphadenopathy.  No pericardial effusion is seen.  The great vessels are unremarkable in appearance. Scattered calcification is noted along the aortic arch.  No axillary lymphadenopathy is seen.  The visualized portions of the thyroid gland are unremarkable in appearance.  Scattered calcifications are noted within the left breast, within normal limits.  No acute osseous abnormalities are seen.   Review of the MIP images confirms the above findings.  IMPRESSION:  1.  No evidence of pulmonary embolus. 2.  Mild bilateral dependent subsegmental atelectasis noted; lungs otherwise clear.  CT ABDOMEN AND PELVIS  Findings: There is diffuse gallbladder wall thickening and a small amount of associated pericholecystic fluid, with surrounding soft tissue inflammation, compatible with acute cholecystitis. Scattered stones are seen at the fundus of the gallbladder, and a 9 mm stone is also noted at the gallbladder neck.  The gallbladder is mildly distended.  A 1.1 cm vague hypodensity within the left hepatic lobe is nonspecific in appearance; this could be further assessed on dynamic liver protocol MRI or CT, as deemed clinically appropriate. The liver is otherwise unremarkable in appearance.  The spleen is within normal limits.  The pancreas and adrenal glands are unremarkable.  The common hepatic duct and common bile duct remains grossly normal in caliber.  The kidneys  are unremarkable in appearance.  There is no evidence of hydronephrosis.  No renal or ureteral stones are seen.  No perinephric stranding is appreciated.  The small bowel is unremarkable in appearance.  The stomach is within normal limits.  No acute vascular abnormalities  are seen.  The appendix is normal in caliber, without evidence for appendicitis.  Contrast progresses to the level of the descending colon; the colon is unremarkable in appearance.  The bladder is mildly distended and grossly unremarkable in appearance.  The uterus is within normal limits.  The ovaries are relatively symmetric; no suspicious adnexal masses are seen.  No inguinal lymphadenopathy is seen.  No acute osseous abnormalities are identified.  Mild vacuum phenomenon is noted at L3-L4.  Mild facet disease is noted along the lumbar spine.  Review of the MIP images confirms the above findings.  IMPRESSION:  1.  Acute cholecystitis, with diffuse gallbladder wall thickening and associated pericholecystic fluid, and surrounding soft tissue inflammation.  Scattered stones noted at the fundus of the gallbladder, and a 9 mm stone noted lodged at the gallbladder neck. 2.  No evidence of distal obstruction; the common hepatic duct and common bile duct remains normal in caliber. 3.  1.1 cm vague hypodensity within the left hepatic lobe, nonspecific in appearance; this could be further assessed on dynamic liver protocol MRI or CT, on an elective basis, when and as deemed clinically appropriate.  These results were called by telephone on 04/03/2012  at  09:13 p.m. to  the physician on call for Dr. Felipa Eth, who verbally acknowledged these results.  Original Report Authenticated By: Tonia Ghent, M.D.   Ct Abdomen Pelvis W Contrast  04/03/2012  *RADIOLOGY REPORT*  Clinical Data:  Mid sternal chest pain and nausea.  Mid to right- sided abdominal pain.  CT ANGIOGRAPHY CHEST CT ABDOMEN AND PELVIS WITH CONTRAST  Technique:  Multidetector CT imaging of the chest was performed using the standard protocol during bolus administration of intravenous contrast.  Multiplanar CT image reconstructions including MIPs were obtained to evaluate the vascular anatomy. Multidetector CT imaging of the abdomen and pelvis was performed using the  standard protocol during bolus administration of intravenous contrast.  Contrast: 100 mL of Omnipaque 300 IV contrast  Comparison:  Pelvic ultrasound performed 02/22/2005  CTA CHEST  Findings:  There is no evidence of pulmonary embolus.  Mild bilateral dependent subsegmental atelectasis is noted.  The lungs are otherwise clear.  There is no evidence of significant focal consolidation, pleural effusion or pneumothorax.  No masses are identified; no abnormal focal contrast enhancement is seen.  The mediastinum is unremarkable in appearance.  There is no evidence of mediastinal lymphadenopathy.  No pericardial effusion is seen.  The great vessels are unremarkable in appearance. Scattered calcification is noted along the aortic arch.  No axillary lymphadenopathy is seen.  The visualized portions of the thyroid gland are unremarkable in appearance.  Scattered calcifications are noted within the left breast, within normal limits.  No acute osseous abnormalities are seen.   Review of the MIP images confirms the above findings.  IMPRESSION:  1.  No evidence of pulmonary embolus. 2.  Mild bilateral dependent subsegmental atelectasis noted; lungs otherwise clear.  CT ABDOMEN AND PELVIS  Findings: There is diffuse gallbladder wall thickening and a small amount of associated pericholecystic fluid, with surrounding soft tissue inflammation, compatible with acute cholecystitis. Scattered stones are seen at the fundus of the gallbladder, and a 9 mm stone is also noted at the gallbladder neck.  The gallbladder is mildly distended.  A 1.1 cm vague hypodensity within the left hepatic lobe is nonspecific in appearance; this could be further assessed on dynamic liver protocol MRI or CT, as deemed clinically appropriate. The liver is otherwise unremarkable in appearance.  The spleen is within normal limits.  The pancreas and adrenal glands are unremarkable.  The common hepatic duct and common bile duct remains grossly normal in caliber.   The kidneys are unremarkable in appearance.  There is no evidence of hydronephrosis.  No renal or ureteral stones are seen.  No perinephric stranding is appreciated.  The small bowel is unremarkable in appearance.  The stomach is within normal limits.  No acute vascular abnormalities are seen.  The appendix is normal in caliber, without evidence for appendicitis.  Contrast progresses to the level of the descending colon; the colon is unremarkable in appearance.  The bladder is mildly distended and grossly unremarkable in appearance.  The uterus is within normal limits.  The ovaries are relatively symmetric; no suspicious adnexal masses are seen.  No inguinal lymphadenopathy is seen.  No acute osseous abnormalities are identified.  Mild vacuum phenomenon is noted at L3-L4.  Mild facet disease is noted along the lumbar spine.  Review of the MIP images confirms the above findings.  IMPRESSION:  1.  Acute cholecystitis, with diffuse gallbladder wall thickening and associated pericholecystic fluid, and surrounding soft tissue inflammation.  Scattered stones noted at the fundus of the gallbladder, and a 9 mm stone noted lodged at the gallbladder neck. 2.  No evidence of distal obstruction; the common hepatic duct and common bile duct remains normal in caliber. 3.  1.1 cm vague hypodensity within the left hepatic lobe, nonspecific in appearance; this could be further assessed on dynamic liver protocol MRI or CT, on an elective basis, when and as deemed clinically appropriate.  These results were called by telephone on 04/03/2012  at  09:13 p.m. to  the physician on call for Dr. Felipa Eth, who verbally acknowledged these results.  Original Report Authenticated By: Tonia Ghent, M.D.    Anti-infectives: Anti-infectives     Start     Dose/Rate Route Frequency Ordered Stop   04/03/12 2359   Ampicillin-Sulbactam (UNASYN) 3 g in sodium chloride 0.9 % 100 mL IVPB        3 g 100 mL/hr over 60 Minutes Intravenous Every 6  hours 04/03/12 2305            Assessment/Plan: s/p Procedure(s) (LRB): LAPAROSCOPIC CHOLECYSTECTOMY WITH INTRAOPERATIVE CHOLANGIOGRAM (N/A) For lap chole with IOC today I discussed the procedure in detail.  The patient was given Agricultural engineer.  We discussed the risks and benefits of a laparoscopic cholecystectomy and possible cholangiogram including, but not limited to bleeding, infection, injury to surrounding structures such as the intestine or liver, bile leak, retained gallstones, need to convert to an open procedure, prolonged diarrhea, blood clots such as  DVT, common bile duct injury, anesthesia risks, and possible need for additional procedures.  The likelihood of improvement in symptoms and return to the patient's normal status is good. We discussed the typical post-operative recovery course.   LOS: 1 day    Dameian Crisman E 04/04/2012

## 2012-04-05 ENCOUNTER — Encounter (HOSPITAL_COMMUNITY): Payer: Self-pay | Admitting: General Surgery

## 2012-04-05 MED ORDER — OXYCODONE-ACETAMINOPHEN 5-325 MG PO TABS: 1.0000 | ORAL_TABLET | ORAL | Status: AC | PRN

## 2012-04-05 NOTE — Progress Notes (Signed)
Patient discharged to home as ordered, instructions given and verbalized understanding, husband at bedside

## 2012-04-05 NOTE — Discharge Summary (Signed)
Lorrinda Ramstad, MD, MPH, FACS Pager: [phone]  

## 2012-04-05 NOTE — Discharge Instructions (Signed)
[facility] ______[facility] °LAPAROSCOPIC SURGERY: POST OP INSTRUCTIONS °Always review your discharge instruction sheet given to you by the facility where your surgery was performed. °IF YOU HAVE DISABILITY OR FAMILY LEAVE FORMS, YOU MUST BRING THEM TO THE OFFICE FOR PROCESSING.   °DO NOT GIVE THEM TO YOUR DOCTOR. ° °1. A prescription for pain medication may be given to you upon discharge.  Take your pain medication as prescribed, if needed.  If narcotic pain medicine is not needed, then you may take acetaminophen (Tylenol) or ibuprofen (Advil) as needed. °2. Take your usually prescribed medications unless otherwise directed. °3. If you need a refill on your pain medication, please contact your pharmacy.  They will contact our office to request authorization. Prescriptions will not be filled after 5pm or on week-ends. °4. You should follow a light diet the first few days after arrival home, such as soup and crackers, etc.  Be sure to include lots of fluids daily. °5. Most patients will experience some swelling and bruising in the area of the incisions.  Ice packs will help.  Swelling and bruising can take several days to resolve.  °6. It is common to experience some constipation if taking pain medication after surgery.  Increasing fluid intake and taking a stool softener (such as Colace) will usually help or prevent this problem from occurring.  A mild laxative (Milk of Magnesia or Miralax) should be taken according to package instructions if there are no bowel movements after 48 hours. °7. Unless discharge instructions indicate otherwise, you may remove your bandages 24-48 hours after surgery, and you may shower at that time.  You may have steri-strips (small skin tapes) in place directly over the incision.  These strips should be left on the skin for 7-10 days.  If your surgeon used skin glue on the incision, you may shower in 24 hours.  The glue will flake off over the next 2-3 weeks.  Any sutures or  staples will be removed at the office during your follow-up visit. °8. ACTIVITIES:  You may resume regular (light) daily activities beginning the next day--such as daily self-care, walking, climbing stairs--gradually increasing activities as tolerated.  You may have sexual intercourse when it is comfortable.  Refrain from any heavy lifting or straining until approved by your doctor. °a. You may drive when you are no longer taking prescription pain medication, you can comfortably wear a seatbelt, and you can safely maneuver your car and apply brakes. °b. RETURN TO WORK:  __________________________________________________________ °9. You should see your doctor in the office for a follow-up appointment approximately 2-3 weeks after your surgery.  Make sure that you call for this appointment within a day or two after you arrive home to insure a convenient appointment time. °10. OTHER INSTRUCTIONS: __________________________________________________________________________________________________________________________ __________________________________________________________________________________________________________________________ °WHEN TO CALL YOUR DOCTOR: °1. Fever over 101.0 °2. Inability to urinate °3. Continued bleeding from incision. °4. Increased pain, redness, or drainage from the incision. °5. Increasing abdominal pain ° °The clinic staff is available to answer your questions during regular business hours.  Please don’t hesitate to call and ask to speak to one of the nurses for clinical concerns.  If you have a medical emergency, go to the nearest emergency room or call 911.  A surgeon from [facility] is always on call at the hospital. °[address], [address], [place], [place]  [address] ? [address] [address], [place], [place]   [address] °[phone] ? [phone] ? FAX [phone] °Web site:   [email or web address] °

## 2012-04-05 NOTE — Discharge Summary (Signed)
Patient ID: [name] MRN: [id number] DOB/AGE: [birth year] 71 y.o.  Admit date: 04/03/2012 Discharge date: 04/05/2012  Procedures: lap chole  Consults: None  Reason for Admission: This is a 71 yo female was who admitted secondary to cholecystitis.  Please see admitting H&P for further details.  Admission Diagnoses:  1. Acute cholecystitis  Hospital Course: The patient was admitted and taken to the operating room.  She then underwent a laparoscopic cholecystectomy, which she tolerated well.  On POD# 1, she was having minimal pain and tolerating a regular diet.  She was felt stable for discharge home at this time.  PE: Abd: soft, minimally tender, +BS, ND, incisions c/d/i  Discharge Diagnoses:  1. Acute cholecysitits 2. S/p lap chole  Discharge Medications: Medication List  As of 04/05/2012 11:13 AM   TAKE these medications         aspirin 325 MG tablet   Take 325 mg by mouth daily as needed. For pain      IMITREX 100 MG tablet   Generic drug: SUMAtriptan   Take 100 mg by mouth every 2 (two) hours as needed. For headache      levothyroxine 112 MCG tablet   Commonly known as: SYNTHROID, LEVOTHROID   Take 56 mcg by mouth daily. Pt takes 1/2 tablet daily      omeprazole 20 MG capsule   Commonly known as: PRILOSEC   Take 20 mg by mouth 2 (two) times daily with a meal.      oxyCODONE-acetaminophen 5-325 MG per tablet   Commonly known as: PERCOCET   Take 1-2 tablets by mouth every 4 (four) hours as needed.      venlafaxine 75 MG tablet   Commonly known as: EFFEXOR   Take 75 mg by mouth daily.            Discharge Instructions: Follow-up Information    Follow up with [facility] E, MD. Schedule an appointment as soon as possible for a visit in 3 weeks.   Contact information:   [facility] [address] [place] [address] [phone]          Signed: Letha Cape 04/05/2012, 11:13 AM

## 2012-04-30 DIAGNOSIS — H35379 Puckering of macula, unspecified eye: Secondary | ICD-10-CM | POA: Diagnosis not present

## 2012-04-30 DIAGNOSIS — H3581 Retinal edema: Secondary | ICD-10-CM | POA: Diagnosis not present

## 2012-05-08 ENCOUNTER — Ambulatory Visit (INDEPENDENT_AMBULATORY_CARE_PROVIDER_SITE_OTHER): Payer: Medicare Other | Admitting: General Surgery

## 2012-05-08 ENCOUNTER — Encounter (INDEPENDENT_AMBULATORY_CARE_PROVIDER_SITE_OTHER): Payer: Self-pay | Admitting: General Surgery

## 2012-05-08 VITALS — BP 112/78 | HR 72 | Temp 98.1°F | Resp 18 | Ht 64.0 in | Wt 150.0 lb

## 2012-05-08 DIAGNOSIS — Z9049 Acquired absence of other specified parts of digestive tract: Secondary | ICD-10-CM

## 2012-05-08 DIAGNOSIS — Z9889 Other specified postprocedural states: Secondary | ICD-10-CM

## 2012-05-08 NOTE — Progress Notes (Signed)
Subjective:     Patient ID: [name], female   DOB: 08-24-1941, 71 y.o.   MRN: [id number]  HPI Patient presents to [place] post cholecystectomy. She's doing well. She is having no pain. She has occasional constipation.  Review of Systems     Objective:   Physical Exam Abdomen is soft and nontender. All wounds are well-healed. There is no sign of infection.    Assessment:     Doing very well status post laparoscopic cholecystectomy    Plan:     Return p.r.n.

## 2012-07-08 ENCOUNTER — Emergency Department (HOSPITAL_COMMUNITY): Payer: Medicare Other

## 2012-07-08 ENCOUNTER — Encounter (HOSPITAL_COMMUNITY): Payer: Self-pay | Admitting: *Deleted

## 2012-07-08 ENCOUNTER — Emergency Department (HOSPITAL_COMMUNITY)
Admission: EM | Admit: 2012-07-08 | Discharge: 2012-07-09 | Disposition: A | Discharge status: Home / Self Care | Payer: Medicare Other | Attending: Emergency Medicine | Admitting: Emergency Medicine

## 2012-07-08 DIAGNOSIS — IMO0002 Reserved for concepts with insufficient information to code with codable children: Secondary | ICD-10-CM | POA: Diagnosis not present

## 2012-07-08 DIAGNOSIS — S6990XA Unspecified injury of unspecified wrist, hand and finger(s), initial encounter: Secondary | ICD-10-CM | POA: Insufficient documentation

## 2012-07-08 DIAGNOSIS — S61209A Unspecified open wound of unspecified finger without damage to nail, initial encounter: Secondary | ICD-10-CM | POA: Insufficient documentation

## 2012-07-08 DIAGNOSIS — Z79899 Other long term (current) drug therapy: Secondary | ICD-10-CM | POA: Diagnosis not present

## 2012-07-08 DIAGNOSIS — W010XXA Fall on same level from slipping, tripping and stumbling without subsequent striking against object, initial encounter: Secondary | ICD-10-CM | POA: Insufficient documentation

## 2012-07-08 DIAGNOSIS — S6980XA Other specified injuries of unspecified wrist, hand and finger(s), initial encounter: Secondary | ICD-10-CM | POA: Insufficient documentation

## 2012-07-08 DIAGNOSIS — S61219A Laceration without foreign body of unspecified finger without damage to nail, initial encounter: Secondary | ICD-10-CM

## 2012-07-08 NOTE — ED Notes (Signed)
Patient fell today and injured her left 3rd finger.  Patient has about an inch laceration to that finger.  Patient also has abrasions on her left knee

## 2012-07-08 NOTE — ED Notes (Signed)
MD at bedside. 

## 2012-07-08 NOTE — ED Provider Notes (Signed)
History     CSN: [id number]  Arrival date & time 07/08/12  2001   First MD Initiated Contact with Patient 07/08/12 2142      Chief Complaint  Patient presents with  . Finger Injury    (Consider location/radiation/quality/duration/timing/severity/associated sxs/prior treatment) HPI Comments: Patient is a 71 year-old who presents with pain and laceration in the 3rd finger of her left hand. The injury was sustained earlier today when she fell on a gravel driveway and caught herself with her left hand. She notes pain at the site of the laceration. Range of motion is limited by pain. She denies numbness and tingling. She denies joint pain in her left hand and wrist. She denies any other injuries. She has good capillary refill in all digits of her left hand.    History reviewed. No pertinent past medical history.  Past Surgical History  Procedure Date  . Thyroid surgery 15 years ago  . Cholecystectomy 04/04/2012    Procedure: LAPAROSCOPIC CHOLECYSTECTOMY WITH INTRAOPERATIVE CHOLANGIOGRAM;  Surgeon: Liz Malady, MD;  Location: [facility] OR;  Service: General;  Laterality: N/A;    No family history on file.  History  Substance Use Topics  . Smoking status: Never Smoker   . Smokeless tobacco: Not on file  . Alcohol Use: No    OB History    Grav Para Term Preterm Abortions TAB SAB Ect Mult Living                  Review of Systems  Skin: Positive for wound.  Neurological: Negative for syncope, weakness, numbness and headaches.    Allergies  Review of patient's allergies indicates no known allergies.  Home Medications   Current Outpatient Rx  Name Route Sig Dispense Refill  . LEVOTHYROXINE SODIUM 112 MCG PO TABS Oral Take 56 mcg by mouth daily. Pt takes 1/2 tablet daily    . SUMATRIPTAN SUCCINATE 100 MG PO TABS Oral Take 100 mg by mouth every 2 (two) hours as needed. For headache    . VENLAFAXINE HCL 75 MG PO TABS Oral Take 75 mg by mouth daily.      BP 139/64  Pulse 88   Temp 98.3 F (36.8 C) (Oral)  Resp 18  SpO2 96%  Physical Exam  Nursing note and vitals reviewed. Constitutional: She is oriented to person, place, and time. She appears well-developed and well-nourished. No distress.  HENT:  Head: Normocephalic and atraumatic.  Neck: Neck supple.  Pulmonary/Chest: Effort normal.  Musculoskeletal:       Left knee: no tenderness found.       Left hand: She exhibits laceration. She exhibits normal range of motion, no bony tenderness, normal capillary refill, no deformity and no swelling. normal sensation noted. Normal strength noted.       Hands:      Left third finger with laceration.  Hemostatic.  Capillary refill < 2 seconds.  Full AROM and strength, sensation intact, no bony tenderness. Other fingers normal strength, sensation, no tenderness.      Left knee with superficial abrasion.  Nontender.  Full AROM.    Neurological: She is alert and oriented to person, place, and time. She exhibits normal muscle tone. Gait normal. GCS eye subscore is 4. GCS verbal subscore is 5. GCS motor subscore is 6.  Skin: She is not diaphoretic.  Psychiatric: She has a normal mood and affect. Her behavior is normal.    ED Course  Procedures (including critical care time)  Labs Reviewed -  No data to display No results found.  LACERATION REPAIR Performed by: Rise Patience Consent: Verbal consent obtained. Risks and benefits: risks, benefits and alternatives were discussed Patient identity confirmed: provided demographic data Time out performed prior to procedure Prepped and Draped in normal sterile fashion Wound explored  Laceration Location: finger  Laceration Length: 1cm  No Foreign Bodies seen or palpated  Anesthesia: digital block  Local anesthetic: lidocaine 2% no epinephrine  Anesthetic total: 4 ml  Irrigation method: syringe Amount of cleaning: standard  Skin closure: 6-0 nylon  Number of sutures or staples: 7  Technique: simple  interrupted.   Patient tolerance: Patient tolerated the procedure well with no immediate complications.   10:11 PM Dr Jeraldine Loots made aware of patient.  1. Finger laceration       MDM  Pt with fall onto driveway, sustaining laceration, pt with no other complaints, no other apparent injuries.  Laceration repaired in ED.  Pt d/c home with PCP follow up, return precautions.  Patient verbalizes understanding and agrees with plan.          [name], [place] 07/09/12 [phone]

## 2012-07-08 NOTE — ED Notes (Signed)
Pt. Reports she fell and cut open his left 4th finger. States she is unsure of what she cut it on. Reports walking on gravel and was near a 2 X 4. States tetanus is up to date, believes last tetanus 2010. 1 cm Laceration on left 4th digit.

## 2012-07-08 NOTE — ED Provider Notes (Signed)
I have also seen and evaluated the patient.  On my exam there was no active bleeding from the laceration of her ring finger.  XR w/o FB or Fx.   Gerhard Munch, MD 07/08/12 [phone]

## 2012-07-09 NOTE — Progress Notes (Signed)
Orthopedic Tech Progress Note Patient Details:  [name] Nam 07/02/1941 [id number]  Ortho Devices Type of Ortho Device: Finger splint Ortho Device/Splint Location: Left 4th digit Ortho Device/Splint Interventions: Application   Asia R Thompson 07/09/2012, 12:29 AM

## 2012-07-10 NOTE — ED Provider Notes (Signed)
Medical screening examination/treatment/procedure(s) were conducted as a shared visit with non-physician practitioner(s) and myself.  I personally evaluated the patient during the encounter On my exam this pleasant elderly F p/w ring finger lac.  No e/o tendonous disruption, nor FB retention. Patient d/c in stable condition.  Gerhard Munch, MD 07/10/12 [phone]

## 2012-07-17 DIAGNOSIS — L439 Lichen planus, unspecified: Secondary | ICD-10-CM | POA: Diagnosis not present

## 2012-07-17 DIAGNOSIS — N766 Ulceration of vulva: Secondary | ICD-10-CM | POA: Diagnosis not present

## 2012-07-22 DIAGNOSIS — S61209A Unspecified open wound of unspecified finger without damage to nail, initial encounter: Secondary | ICD-10-CM | POA: Diagnosis not present

## 2012-07-22 DIAGNOSIS — Z4802 Encounter for removal of sutures: Secondary | ICD-10-CM | POA: Diagnosis not present

## 2012-07-24 DIAGNOSIS — L439 Lichen planus, unspecified: Secondary | ICD-10-CM | POA: Diagnosis not present

## 2012-07-24 DIAGNOSIS — L253 Unspecified contact dermatitis due to other chemical products: Secondary | ICD-10-CM | POA: Diagnosis not present

## 2012-07-24 DIAGNOSIS — L538 Other specified erythematous conditions: Secondary | ICD-10-CM | POA: Diagnosis not present

## 2012-07-24 DIAGNOSIS — L821 Other seborrheic keratosis: Secondary | ICD-10-CM | POA: Diagnosis not present

## 2012-07-25 DIAGNOSIS — H35379 Puckering of macula, unspecified eye: Secondary | ICD-10-CM | POA: Diagnosis not present

## 2012-07-26 DIAGNOSIS — E7800 Pure hypercholesterolemia, unspecified: Secondary | ICD-10-CM | POA: Diagnosis not present

## 2012-07-26 DIAGNOSIS — E039 Hypothyroidism, unspecified: Secondary | ICD-10-CM | POA: Diagnosis not present

## 2012-07-26 DIAGNOSIS — R82998 Other abnormal findings in urine: Secondary | ICD-10-CM | POA: Diagnosis not present

## 2012-08-02 DIAGNOSIS — E7800 Pure hypercholesterolemia, unspecified: Secondary | ICD-10-CM | POA: Diagnosis not present

## 2012-08-02 DIAGNOSIS — E039 Hypothyroidism, unspecified: Secondary | ICD-10-CM | POA: Diagnosis not present

## 2012-08-02 DIAGNOSIS — Z0000 Encounter for general adult medical examination without abnormal findings: Secondary | ICD-10-CM | POA: Diagnosis not present

## 2012-08-02 DIAGNOSIS — F3289 Other specified depressive episodes: Secondary | ICD-10-CM | POA: Diagnosis not present

## 2012-08-02 DIAGNOSIS — F329 Major depressive disorder, single episode, unspecified: Secondary | ICD-10-CM | POA: Diagnosis not present

## 2012-08-05 DIAGNOSIS — Z1212 Encounter for screening for malignant neoplasm of rectum: Secondary | ICD-10-CM | POA: Diagnosis not present

## 2012-08-20 DIAGNOSIS — Z113 Encounter for screening for infections with a predominantly sexual mode of transmission: Secondary | ICD-10-CM | POA: Diagnosis not present

## 2012-08-20 DIAGNOSIS — Z124 Encounter for screening for malignant neoplasm of cervix: Secondary | ICD-10-CM | POA: Diagnosis not present

## 2012-10-03 DIAGNOSIS — Z23 Encounter for immunization: Secondary | ICD-10-CM | POA: Diagnosis not present

## 2012-10-31 DIAGNOSIS — H3581 Retinal edema: Secondary | ICD-10-CM | POA: Diagnosis not present

## 2012-12-05 DIAGNOSIS — Z961 Presence of intraocular lens: Secondary | ICD-10-CM | POA: Diagnosis not present

## 2012-12-05 DIAGNOSIS — H31009 Unspecified chorioretinal scars, unspecified eye: Secondary | ICD-10-CM | POA: Diagnosis not present

## 2013-01-30 DIAGNOSIS — D239 Other benign neoplasm of skin, unspecified: Secondary | ICD-10-CM | POA: Diagnosis not present

## 2013-01-30 DIAGNOSIS — L909 Atrophic disorder of skin, unspecified: Secondary | ICD-10-CM | POA: Diagnosis not present

## 2013-01-30 DIAGNOSIS — L819 Disorder of pigmentation, unspecified: Secondary | ICD-10-CM | POA: Diagnosis not present

## 2013-01-30 DIAGNOSIS — D1801 Hemangioma of skin and subcutaneous tissue: Secondary | ICD-10-CM | POA: Diagnosis not present

## 2013-01-30 DIAGNOSIS — L919 Hypertrophic disorder of the skin, unspecified: Secondary | ICD-10-CM | POA: Diagnosis not present

## 2013-01-30 DIAGNOSIS — L821 Other seborrheic keratosis: Secondary | ICD-10-CM | POA: Diagnosis not present

## 2013-01-30 DIAGNOSIS — L820 Inflamed seborrheic keratosis: Secondary | ICD-10-CM | POA: Diagnosis not present

## 2013-01-30 DIAGNOSIS — L739 Follicular disorder, unspecified: Secondary | ICD-10-CM | POA: Diagnosis not present

## 2013-01-30 DIAGNOSIS — L608 Other nail disorders: Secondary | ICD-10-CM | POA: Diagnosis not present

## 2013-04-01 DIAGNOSIS — L02619 Cutaneous abscess of unspecified foot: Secondary | ICD-10-CM | POA: Diagnosis not present

## 2013-04-01 DIAGNOSIS — L923 Foreign body granuloma of the skin and subcutaneous tissue: Secondary | ICD-10-CM | POA: Diagnosis not present

## 2013-04-01 DIAGNOSIS — M2040 Other hammer toe(s) (acquired), unspecified foot: Secondary | ICD-10-CM | POA: Diagnosis not present

## 2013-04-01 DIAGNOSIS — M779 Enthesopathy, unspecified: Secondary | ICD-10-CM | POA: Diagnosis not present

## 2013-04-15 DIAGNOSIS — M216X9 Other acquired deformities of unspecified foot: Secondary | ICD-10-CM | POA: Diagnosis not present

## 2013-04-15 DIAGNOSIS — M779 Enthesopathy, unspecified: Secondary | ICD-10-CM | POA: Diagnosis not present

## 2013-04-15 DIAGNOSIS — Z79899 Other long term (current) drug therapy: Secondary | ICD-10-CM | POA: Diagnosis not present

## 2013-05-01 ENCOUNTER — Ambulatory Visit
Admission: RE | Admit: 2013-05-01 | Discharge: 2013-05-01 | Disposition: A | Discharge status: Home / Self Care | Payer: Medicare Other | Source: Ambulatory Visit | Attending: Internal Medicine | Admitting: Internal Medicine

## 2013-05-01 ENCOUNTER — Other Ambulatory Visit: Payer: Self-pay | Admitting: Internal Medicine

## 2013-05-01 ENCOUNTER — Telehealth: Payer: Self-pay | Admitting: Internal Medicine

## 2013-05-01 DIAGNOSIS — M545 Low back pain, unspecified: Secondary | ICD-10-CM

## 2013-05-01 DIAGNOSIS — K921 Melena: Secondary | ICD-10-CM

## 2013-05-01 DIAGNOSIS — R1031 Right lower quadrant pain: Secondary | ICD-10-CM

## 2013-05-01 DIAGNOSIS — M2550 Pain in unspecified joint: Secondary | ICD-10-CM | POA: Diagnosis not present

## 2013-05-01 DIAGNOSIS — Z6825 Body mass index (BMI) 25.0-25.9, adult: Secondary | ICD-10-CM | POA: Diagnosis not present

## 2013-05-01 DIAGNOSIS — K649 Unspecified hemorrhoids: Secondary | ICD-10-CM | POA: Diagnosis not present

## 2013-05-01 DIAGNOSIS — M47817 Spondylosis without myelopathy or radiculopathy, lumbosacral region: Secondary | ICD-10-CM | POA: Diagnosis not present

## 2013-05-01 MED ORDER — IOHEXOL 300 MG/ML  SOLN
100.0000 mL | Freq: Once | INTRAMUSCULAR | Status: AC | PRN
  Administered 2013-05-01: 100 mL via INTRAVENOUS

## 2013-05-01 NOTE — Telephone Encounter (Signed)
I have left a voicemail for [name] that patient can come to see [name] RNP tomorrow 04/02/13 10:30.  She is asked to fax notes to [facility]

## 2013-05-02 ENCOUNTER — Encounter: Payer: Self-pay | Admitting: *Deleted

## 2013-05-02 ENCOUNTER — Ambulatory Visit: Payer: Medicare Other | Admitting: Nurse Practitioner

## 2013-05-05 ENCOUNTER — Ambulatory Visit (INDEPENDENT_AMBULATORY_CARE_PROVIDER_SITE_OTHER): Payer: Medicare Other | Admitting: Physician Assistant

## 2013-05-05 ENCOUNTER — Encounter: Payer: Self-pay | Admitting: *Deleted

## 2013-05-05 VITALS — BP 110/80 | HR 80 | Ht 64.27 in | Wt 148.4 lb

## 2013-05-05 DIAGNOSIS — Z9089 Acquired absence of other organs: Secondary | ICD-10-CM

## 2013-05-05 DIAGNOSIS — Z800 Family history of malignant neoplasm of digestive organs: Secondary | ICD-10-CM

## 2013-05-05 DIAGNOSIS — E039 Hypothyroidism, unspecified: Secondary | ICD-10-CM

## 2013-05-05 DIAGNOSIS — R109 Unspecified abdominal pain: Secondary | ICD-10-CM | POA: Diagnosis not present

## 2013-05-05 DIAGNOSIS — Z9049 Acquired absence of other specified parts of digestive tract: Secondary | ICD-10-CM

## 2013-05-05 DIAGNOSIS — K219 Gastro-esophageal reflux disease without esophagitis: Secondary | ICD-10-CM

## 2013-05-05 MED ORDER — GLYCOPYRROLATE 2 MG PO TABS: ORAL_TABLET | ORAL | Status: DC

## 2013-05-05 MED ORDER — NA SULFATE-K SULFATE-MG SULF 17.5-3.13-1.6 GM/177ML PO SOLN: 1.0000 | Freq: Once | ORAL | Status: DC

## 2013-05-05 NOTE — Patient Instructions (Addendum)
You have been scheduled for a colonoscopy- please see separate instruction sheet  I have sent a prescription to your pharmacy please pick this up and follow the instructions on the bottle- Robinul Forte  Please contact Dr. Felipa Eth for a referral to an orthopedic MD for your ongoing back pain

## 2013-05-06 ENCOUNTER — Encounter: Payer: Self-pay | Admitting: Physician Assistant

## 2013-05-06 DIAGNOSIS — E039 Hypothyroidism, unspecified: Secondary | ICD-10-CM | POA: Insufficient documentation

## 2013-05-06 DIAGNOSIS — Z800 Family history of malignant neoplasm of digestive organs: Secondary | ICD-10-CM | POA: Insufficient documentation

## 2013-05-06 DIAGNOSIS — K219 Gastro-esophageal reflux disease without esophagitis: Secondary | ICD-10-CM | POA: Insufficient documentation

## 2013-05-06 DIAGNOSIS — Z9049 Acquired absence of other specified parts of digestive tract: Secondary | ICD-10-CM | POA: Insufficient documentation

## 2013-05-06 NOTE — Progress Notes (Signed)
Subjective:    Patient ID: [name], female    DOB: 04-19-1941, 72 y.o.   MRN: [id number]  HPI  Ronit is a pleasant 72 year old white female known to Dr. Leone Payor. She had colonoscopy in June of 2010 which was a normal exam with a somewhat redundant colon. She is scheduled for 5 year followup interval because of family history of colon cancer in her brother who was diagnosed at age 63. Patient is referred by Dr. Laban Emperor today for evaluation of low back and abdominal pain. She has  undergone CT scan of the abdomen and pelvis on 05/01/2013 which is unremarkable. She status post cholecystectomy in May of 2013. She says she has been having low back pain across her lower back since September of 2013. Says this is fairly constant but worse in the morning and better after she gets up and moves around. She says if she sits for any period of time then she has a significant amount of pain with standing until she gets moving. She feels that the back pain is located more on the right side and comes around into her right abdomen. She has also been experiencing some discomfort on both sides of her abdomen over the past couple of months. Her bowel habits alternate between diarrhea and constipation- more constipation recently and this pain in her side seems to be relieved with bowel movement She has not noted any melena or hematochezia. Her appetite has been fine and her weight has been stable    Review of Systems  Constitutional: Negative.   HENT: Negative.   Eyes: Negative.   Respiratory: Negative.   Cardiovascular: Negative.   Gastrointestinal: Positive for abdominal pain, diarrhea and constipation.  Endocrine: Negative.   Allergic/Immunologic: Negative.   Neurological: Negative.   Hematological: Negative.   Psychiatric/Behavioral: Negative.    Outpatient Prescriptions Prior to Visit  Medication Sig Dispense Refill  . clobetasol ointment (TEMOVATE) 0.05 % Apply 1 application topically 2 (two) times  daily. Apply to area as needed.      . fluticasone (FLONASE) 50 MCG/ACT nasal spray Place 2 sprays into the nose daily. Use 2 sprays both nostrils once per day.      . hydrocortisone (PROCTOZONE-HC) 2.5 % rectal cream Place rectally. Apply to hemorrhoids every 6 hours as needed.      Marland Kitchen levothyroxine (SYNTHROID, LEVOTHROID) 112 MCG tablet Take 56 mcg by mouth daily. Pt takes 1/2 tablet daily      . SUMAtriptan (IMITREX) 100 MG tablet Take 100 mg by mouth every 2 (two) hours as needed. For headache      . venlafaxine (EFFEXOR) 75 MG tablet Take 75 mg by mouth daily.      . mometasone (ELOCON) 0.1 % ointment Apply topically daily. Apply once daily to affected area.      . Omeprazole 20 MG TBEC Take 20 mg by mouth. Take 1 tab twice daily before meals.       No facility-administered medications prior to visit.   Allergies  Allergen Reactions  . Levofloxacin Nausea And Vomiting       Patient Active Problem List   Diagnosis Date Noted  . S/P cholecystectomy 05/06/2013  . Family hx of colon cancer 05/06/2013  . Unspecified hypothyroidism 05/06/2013  . GERD (gastroesophageal reflux disease) 05/06/2013   History  Substance Use Topics  . Smoking status: Never Smoker   . Smokeless tobacco: Never Used  . Alcohol Use: No   family history includes Coronary artery disease in her brother,  father, and mother and Diabetes in her brother and sister.  Objective:   Physical Exam  oh well-developed older white female in no acute distress, pleasant blood pressure 110/80 pulse 80 height 5 foot 4 weight 148. HEENT; nontraumatic normocephalic EOMI PERRLA sclera anicteric,Neck; Supple no JVD, Cardiovascular; regular rate and rhythm with S1-S2 no murmur or gallop, Pulmonary; clear bilaterally, Abdomen ;soft, bowel sounds are present there is no palpable mass or hepatosplenomegaly she does have some mild right lower quadrant tenderness no guarding or rebound, Rectal; exam not done, Extremities ;no clubbing  cyanosis or edema skin warm and dry, Psych ;mood and affect normal and appropriate        Assessment & Plan:  #31  72 year old female with 8 month history of low back pain, now with associated abdominal pain described as discomfort in both sides of her abdomen more prominent on the right. She has chronic problems with alternating bowel habit more suggestive of IBS. Recent CT scan negative I'm not convinced that her symptoms are of GI origin, concerned she may have disc disease in the lumbar spine with a radiculopathy. However given family history of colon cancer and previous colonoscopy 4 years ago will rule out occult malignancy. #2 status post cholecystectomy May 2013 #3 family history of colon cancer- patient's brother #4 hypothyroid Plan trial of Robinul forte 2 mg by mouth every 12 hours when necessary Schedule for colonoscopy with Dr. Benjamine Mola discussed in detail with the patient and she is agreeable to proceed. She was encouraged to seek orthopedic opinion for her persistent lumbar back pain;

## 2013-05-08 ENCOUNTER — Encounter: Payer: Self-pay | Admitting: Internal Medicine

## 2013-05-08 ENCOUNTER — Ambulatory Visit (AMBULATORY_SURGERY_CENTER): Payer: Medicare Other | Admitting: Internal Medicine

## 2013-05-08 VITALS — BP 125/78 | HR 72 | Temp 98.3°F | Resp 16 | Ht 64.0 in | Wt 148.0 lb

## 2013-05-08 DIAGNOSIS — K625 Hemorrhage of anus and rectum: Secondary | ICD-10-CM | POA: Diagnosis not present

## 2013-05-08 DIAGNOSIS — K648 Other hemorrhoids: Secondary | ICD-10-CM | POA: Diagnosis not present

## 2013-05-08 DIAGNOSIS — D126 Benign neoplasm of colon, unspecified: Secondary | ICD-10-CM

## 2013-05-08 DIAGNOSIS — R109 Unspecified abdominal pain: Secondary | ICD-10-CM

## 2013-05-08 DIAGNOSIS — K219 Gastro-esophageal reflux disease without esophagitis: Secondary | ICD-10-CM | POA: Diagnosis not present

## 2013-05-08 DIAGNOSIS — E039 Hypothyroidism, unspecified: Secondary | ICD-10-CM | POA: Diagnosis not present

## 2013-05-08 MED ORDER — SODIUM CHLORIDE 0.9 % IV SOLN: 500.0000 mL | INTRAVENOUS | Status: DC

## 2013-05-08 NOTE — Patient Instructions (Addendum)
There was one tiny polyp removed - it looked benign. Do not worry about this. I will let you know pathology results and when to have another routine colonoscopy by mail.  You have some hemorrhoids as you were aware. Everything else looked ok.  I suspect you will need a routine colonoscopy in 5 years (2019).  As far as your pain, it sounds like it may be from your back.  Please see if the glycopyrrolate helps and follow-up with Dr. Felipa Eth  as previously recommended.  I appreciate the opportunity to care for you.  Iva Boop, MD, FACG  YOU HAD AN ENDOSCOPIC PROCEDURE TODAY AT THE [facility]: Refer to the procedure report that was given to you for any specific questions about what was found during the examination.  If the procedure report does not answer your questions, please call your gastroenterologist to clarify.  If you requested that your care partner not be given the details of your procedure findings, then the procedure report has been included in a sealed envelope for you to review at your convenience later.  YOU SHOULD EXPECT: Some feelings of bloating in the abdomen. Passage of more gas than usual.  Walking can help get rid of the air that was put into your GI tract during the procedure and reduce the bloating. If you had a lower endoscopy (such as a colonoscopy or flexible sigmoidoscopy) you may notice spotting of blood in your stool or on the toilet paper. If you underwent a bowel prep for your procedure, then you may not have a normal bowel movement for a few days.  DIET: Your first meal following the procedure should be a light meal and then it is ok to progress to your normal diet.  A half-sandwich or bowl of soup is an example of a good first meal.  Heavy or fried foods are harder to digest and may make you feel nauseous or bloated.  Likewise meals heavy in dairy and vegetables can cause extra gas to form and this can also increase the bloating.  Drink plenty of  fluids but you should avoid alcoholic beverages for 24 hours.  ACTIVITY: Your care partner should take you home directly after the procedure.  You should plan to take it easy, moving slowly for the rest of the day.  You can resume normal activity the day after the procedure however you should NOT DRIVE or use heavy machinery for 24 hours (because of the sedation medicines used during the test).    SYMPTOMS TO REPORT IMMEDIATELY: A gastroenterologist can be reached at any hour.  During normal business hours, 8:30 AM to 5:00 PM Monday through Friday, call [phone].  After hours and on weekends, please call the GI answering service at [phone] who will take a message and have the physician on call contact you.   Following lower endoscopy (colonoscopy or flexible sigmoidoscopy):  Excessive amounts of blood in the stool  Significant tenderness or worsening of abdominal pains  Swelling of the abdomen that is new, acute  Fever of 100F or higher  FOLLOW UP: If any biopsies were taken you will be contacted by phone or by letter within the next 1-3 weeks.  Call your gastroenterologist if you have not heard about the biopsies in 3 weeks.  Our staff will call the home number listed on your records the next business day following your procedure to check on you and address any questions or concerns that you may have at  that time regarding the information given to you following your procedure. This is a courtesy call and so if there is no answer at the home number and we have not heard from you through the emergency physician on call, we will assume that you have returned to your regular daily activities without incident.  SIGNATURES/CONFIDENTIALITY: You and/or your care partner have signed paperwork which will be entered into your electronic medical record.  These signatures attest to the fact that that the information above on your After Visit Summary has been reviewed and is understood.  Full  responsibility of the confidentiality of this discharge information lies with you and/or your care-partner.

## 2013-05-08 NOTE — Progress Notes (Signed)
Lidocaine-40mg IV prior to Propofol InductionPropofol given over incremental dosages 

## 2013-05-08 NOTE — Progress Notes (Signed)
Patient did not experience any of the following events: a burn prior to discharge; a fall within the facility; wrong site/side/patient/procedure/implant event; or a hospital transfer or hospital admission upon discharge from the facility. (G8907) Patient did not have preoperative order for IV antibiotic SSI prophylaxis. (G8918)  

## 2013-05-08 NOTE — Progress Notes (Signed)
Agree with Ms. Esterwood's assessment and plan. Carl E. Gessner, MD, FACG   

## 2013-05-08 NOTE — Op Note (Signed)
[facility] [address] [place], [address]   COLONOSCOPY PROCEDURE REPORT  PATIENT: [name], [name]  MR#: [id number] BIRTHDATE: 04-25-1941 , 72  yrs. old GENDER: Female ENDOSCOPIST: Iva Boop, MD, [facility] PROCEDURE DATE:  05/08/2013 PROCEDURE:   Colonoscopy with snare polypectomy ASA CLASS:   Class II INDICATIONS:Rectal Bleeding and Abdominal pain. MEDICATIONS: propofol (Diprivan) 150mg  IV, MAC sedation, administered by CRNA, and These medications were titrated to patient response per physician's verbal order  DESCRIPTION OF PROCEDURE:   After the risks benefits and alternatives of the procedure were thoroughly explained, informed consent was obtained.  A digital rectal exam revealed no abnormalities of the rectum.   The     endoscope was introduced through the anus and advanced to the terminal ileum which was intubated for a short distance. No adverse events experienced. The quality of the prep was excellent using Suprep  The instrument was then slowly withdrawn as the colon was fully examined.     COLON FINDINGS: A sessile polyp measuring 5 mm in size was found in the transverse colon.  A polypectomy was performed with a cold snare.  The resection was complete and the polyp tissue was completely retrieved.   The colon mucosa was otherwise normal.   A right colon retroflexion was performed.   The mucosa appeared normal in the terminal ileum.  Retroflexed rectal  views revealed internal/external hemorrhoids. The time to cecum=3 minutes 05 seconds.  Withdrawal time=11 minutes 03 seconds.  The scope was withdrawn and the procedure completed. COMPLICATIONS: There were no complications.  ENDOSCOPIC IMPRESSION: 1.   Sessile polyp measuring 5 mm in size was found in the transverse colon; polypectomy was performed with a cold snare 2.   The colon mucosa was otherwise normal 3.   Normal mucosa in the terminal ileum 4.   Internal hemorrhoids 5.   External  hemorrhoids  RECOMMENDATIONS: 1.  Timing of repeat colonoscopy will be determined by pathology findings in patient w/ FHx colon cancer (brother at 69) 2.   See if glycopyrrolate helps pain, follow-up with Dr.  Felipa Eth or othopedics re: back pain  eSigned:  Iva Boop, MD, [facility] 05/08/2013 3:31 PM cc: Chilton Greathouse, MD and The Patient

## 2013-05-08 NOTE — Progress Notes (Signed)
Called to room to assist during endoscopic procedure.  Patient ID and intended procedure confirmed with present staff. Received instructions for my participation in the procedure from the performing physician.  

## 2013-05-09 ENCOUNTER — Telehealth: Payer: Self-pay

## 2013-05-09 NOTE — Telephone Encounter (Signed)
  Follow up Call-  Call back number 05/08/2013  Post procedure Call Back phone  # [phone]  Permission to leave phone message Yes     Patient questions:  Do you have a fever, pain , or abdominal swelling? no Pain Score  0 *  Have you tolerated food without any problems? yes  Have you been able to return to your normal activities? yes  Do you have any questions about your discharge instructions: Diet   no Medications  no Follow up visit  no  Do you have questions or concerns about your Care? no  Actions: * If pain score is 4 or above: No action needed, pain <4.

## 2013-05-13 DIAGNOSIS — H3581 Retinal edema: Secondary | ICD-10-CM | POA: Diagnosis not present

## 2013-05-15 ENCOUNTER — Encounter: Payer: Self-pay | Admitting: Internal Medicine

## 2013-05-15 DIAGNOSIS — Z860100 Personal history of colon polyps, unspecified: Secondary | ICD-10-CM

## 2013-05-15 HISTORY — DX: Personal history of colonic polyps: Z86.010

## 2013-05-15 HISTORY — DX: Personal history of colon polyps, unspecified: Z86.0100

## 2013-05-15 NOTE — Progress Notes (Signed)
Quick Note:  Diminutive adenoma Fhx CRCA Repeat colonoscopy about 04/2018 ______

## 2013-07-30 ENCOUNTER — Other Ambulatory Visit: Payer: Self-pay

## 2013-08-07 DIAGNOSIS — R82998 Other abnormal findings in urine: Secondary | ICD-10-CM | POA: Diagnosis not present

## 2013-08-07 DIAGNOSIS — E7800 Pure hypercholesterolemia, unspecified: Secondary | ICD-10-CM | POA: Diagnosis not present

## 2013-08-07 DIAGNOSIS — E039 Hypothyroidism, unspecified: Secondary | ICD-10-CM | POA: Diagnosis not present

## 2013-08-12 DIAGNOSIS — Z1231 Encounter for screening mammogram for malignant neoplasm of breast: Secondary | ICD-10-CM | POA: Diagnosis not present

## 2013-08-18 DIAGNOSIS — E7800 Pure hypercholesterolemia, unspecified: Secondary | ICD-10-CM | POA: Diagnosis not present

## 2013-08-18 DIAGNOSIS — Z8249 Family history of ischemic heart disease and other diseases of the circulatory system: Secondary | ICD-10-CM | POA: Diagnosis not present

## 2013-08-18 DIAGNOSIS — R059 Cough, unspecified: Secondary | ICD-10-CM | POA: Diagnosis not present

## 2013-08-18 DIAGNOSIS — K649 Unspecified hemorrhoids: Secondary | ICD-10-CM | POA: Diagnosis not present

## 2013-08-18 DIAGNOSIS — J309 Allergic rhinitis, unspecified: Secondary | ICD-10-CM | POA: Diagnosis not present

## 2013-08-18 DIAGNOSIS — M545 Low back pain, unspecified: Secondary | ICD-10-CM | POA: Diagnosis not present

## 2013-08-18 DIAGNOSIS — M2550 Pain in unspecified joint: Secondary | ICD-10-CM | POA: Diagnosis not present

## 2013-08-18 DIAGNOSIS — Z0000 Encounter for general adult medical examination without abnormal findings: Secondary | ICD-10-CM | POA: Diagnosis not present

## 2013-08-18 DIAGNOSIS — R05 Cough: Secondary | ICD-10-CM | POA: Diagnosis not present

## 2013-08-22 DIAGNOSIS — Z1212 Encounter for screening for malignant neoplasm of rectum: Secondary | ICD-10-CM | POA: Diagnosis not present

## 2013-10-20 DIAGNOSIS — Z23 Encounter for immunization: Secondary | ICD-10-CM | POA: Diagnosis not present

## 2013-10-22 DIAGNOSIS — H65199 Other acute nonsuppurative otitis media, unspecified ear: Secondary | ICD-10-CM | POA: Diagnosis not present

## 2013-10-22 DIAGNOSIS — H906 Mixed conductive and sensorineural hearing loss, bilateral: Secondary | ICD-10-CM | POA: Diagnosis not present

## 2013-10-30 ENCOUNTER — Other Ambulatory Visit: Payer: Self-pay

## 2013-11-12 DIAGNOSIS — H65199 Other acute nonsuppurative otitis media, unspecified ear: Secondary | ICD-10-CM | POA: Diagnosis not present

## 2013-11-12 DIAGNOSIS — H906 Mixed conductive and sensorineural hearing loss, bilateral: Secondary | ICD-10-CM | POA: Diagnosis not present

## 2013-12-16 DIAGNOSIS — R82998 Other abnormal findings in urine: Secondary | ICD-10-CM | POA: Diagnosis not present

## 2013-12-16 DIAGNOSIS — R809 Proteinuria, unspecified: Secondary | ICD-10-CM | POA: Diagnosis not present

## 2014-01-27 ENCOUNTER — Other Ambulatory Visit: Payer: Self-pay | Admitting: Dermatology

## 2014-01-27 DIAGNOSIS — D1801 Hemangioma of skin and subcutaneous tissue: Secondary | ICD-10-CM | POA: Diagnosis not present

## 2014-01-27 DIAGNOSIS — D239 Other benign neoplasm of skin, unspecified: Secondary | ICD-10-CM | POA: Diagnosis not present

## 2014-01-27 DIAGNOSIS — L739 Follicular disorder, unspecified: Secondary | ICD-10-CM | POA: Diagnosis not present

## 2014-01-27 DIAGNOSIS — L821 Other seborrheic keratosis: Secondary | ICD-10-CM | POA: Diagnosis not present

## 2014-01-27 DIAGNOSIS — L608 Other nail disorders: Secondary | ICD-10-CM | POA: Diagnosis not present

## 2014-01-27 DIAGNOSIS — D485 Neoplasm of uncertain behavior of skin: Secondary | ICD-10-CM | POA: Diagnosis not present

## 2014-04-06 DIAGNOSIS — H2640 Unspecified secondary cataract: Secondary | ICD-10-CM | POA: Diagnosis not present

## 2014-04-06 DIAGNOSIS — Z961 Presence of intraocular lens: Secondary | ICD-10-CM | POA: Diagnosis not present

## 2014-04-06 DIAGNOSIS — H31009 Unspecified chorioretinal scars, unspecified eye: Secondary | ICD-10-CM | POA: Diagnosis not present

## 2014-05-21 ENCOUNTER — Encounter: Payer: Self-pay | Admitting: Internal Medicine

## 2014-05-21 DIAGNOSIS — H26499 Other secondary cataract, unspecified eye: Secondary | ICD-10-CM | POA: Diagnosis not present

## 2014-05-21 DIAGNOSIS — H3581 Retinal edema: Secondary | ICD-10-CM | POA: Diagnosis not present

## 2014-05-28 DIAGNOSIS — H2640 Unspecified secondary cataract: Secondary | ICD-10-CM | POA: Diagnosis not present

## 2014-05-28 DIAGNOSIS — H26499 Other secondary cataract, unspecified eye: Secondary | ICD-10-CM | POA: Diagnosis not present

## 2014-08-09 IMAGING — CR DG LUMBAR SPINE COMPLETE 4+V
5 series · 5 of 5 positions shown · non-contrast
Comparison: None.

CLINICAL DATA: Pain across lower back.

LUMBAR SPINE - COMPLETE 4+ VIEW

[view not recorded (1 of 5)]
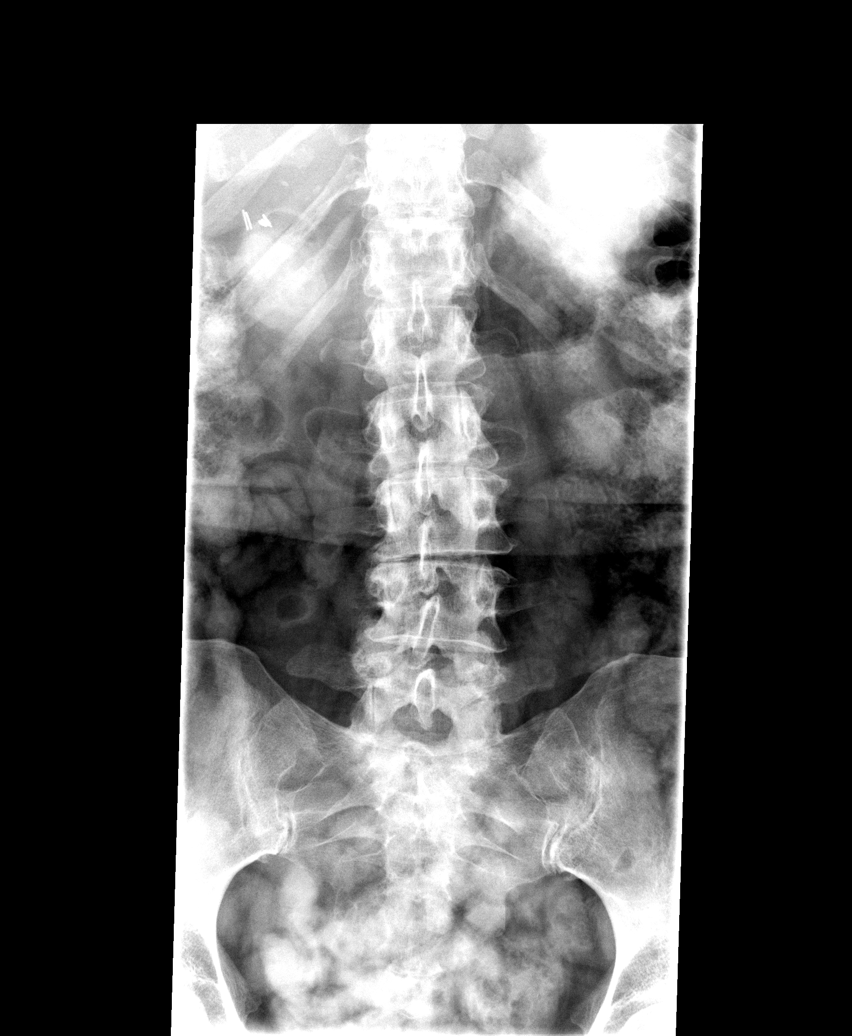

[view not recorded (2 of 5)]
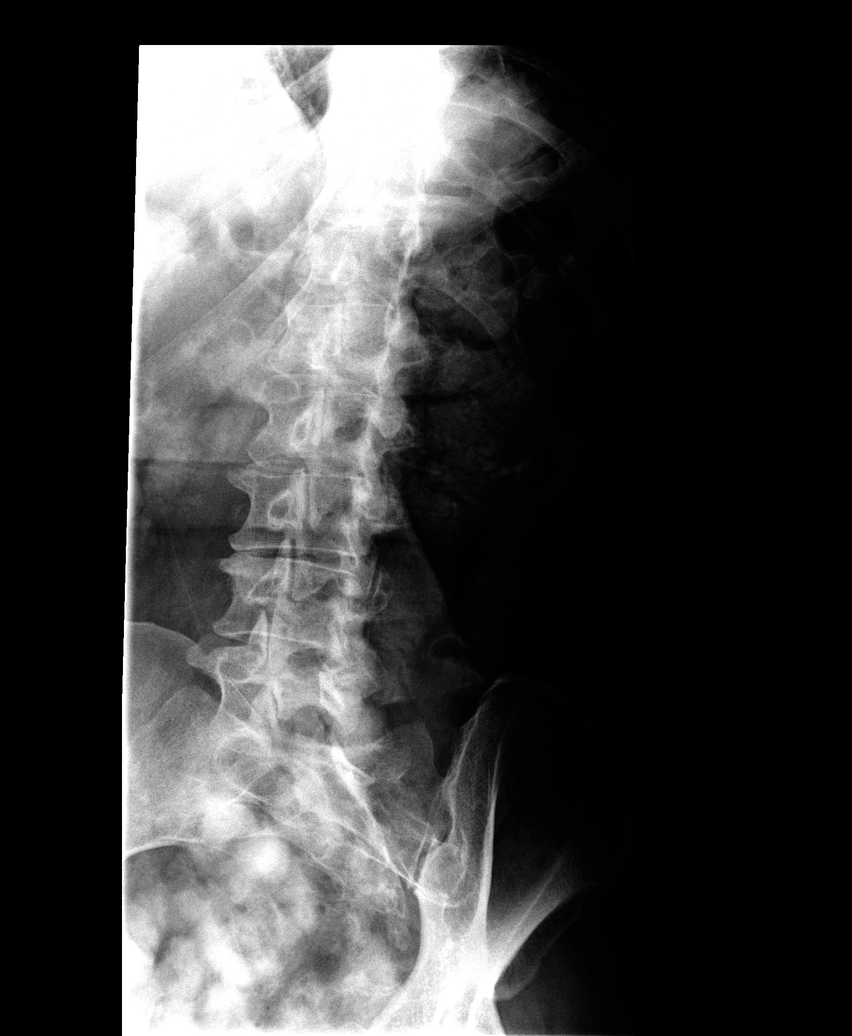

[view not recorded (3 of 5)]
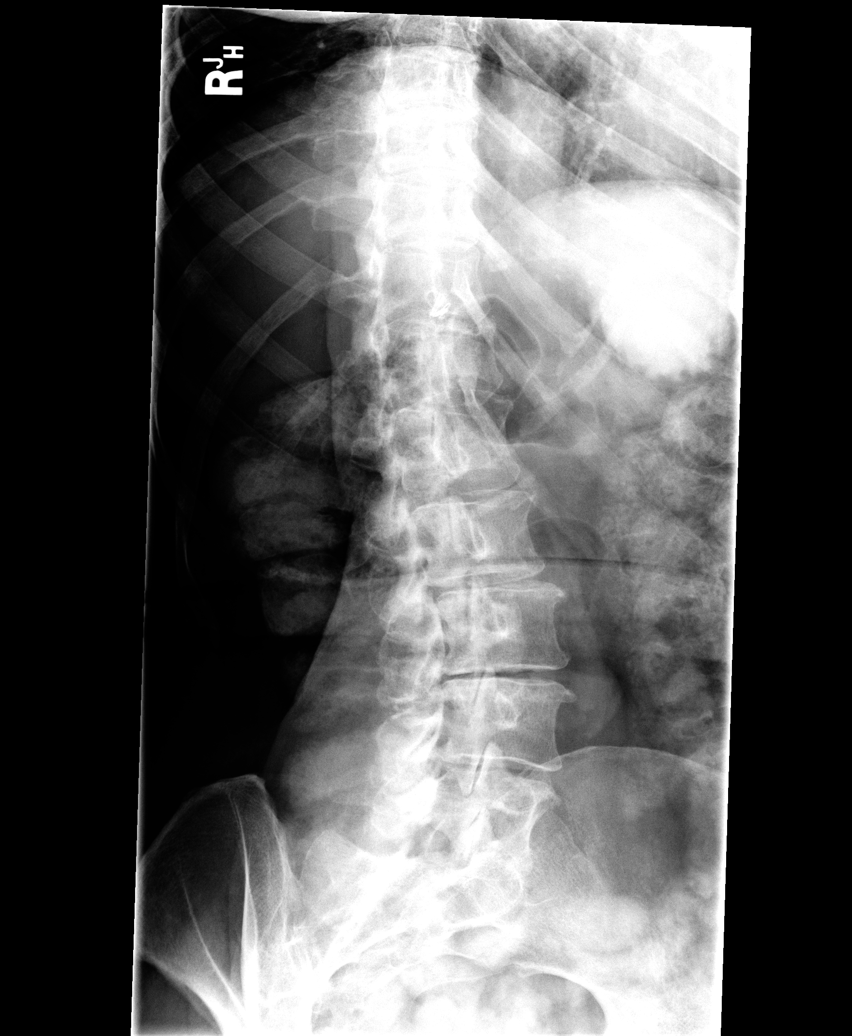

[view not recorded (4 of 5)]
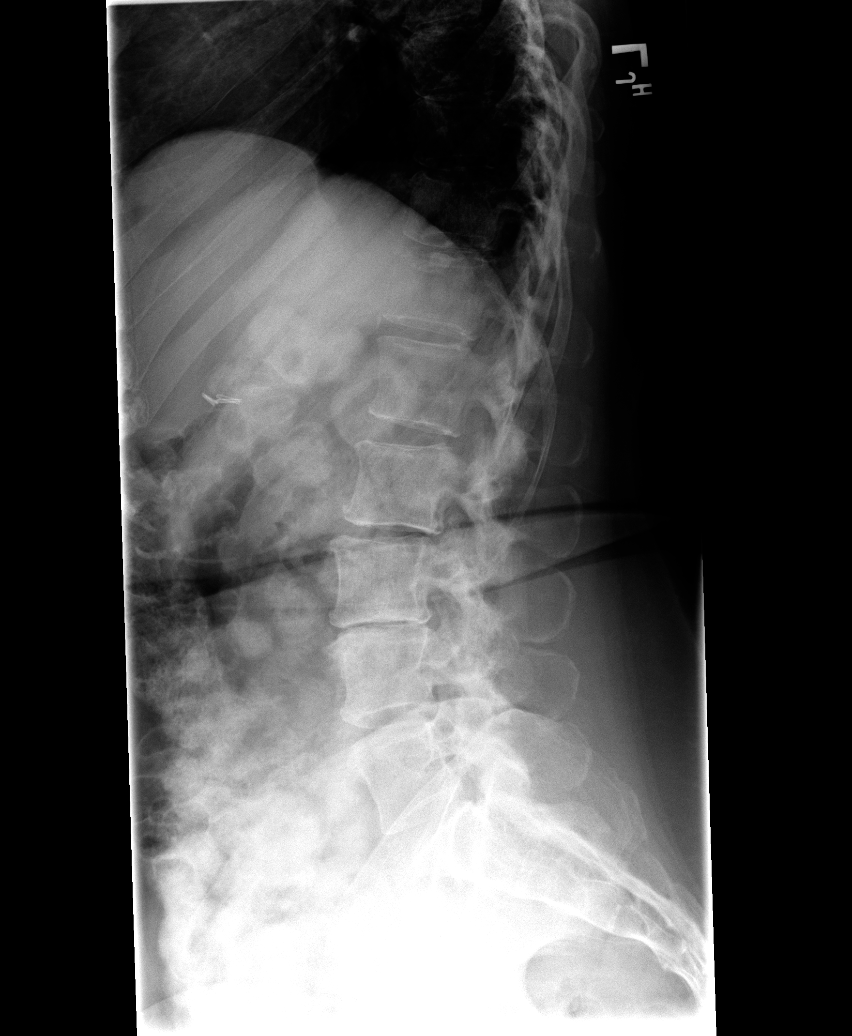

[view not recorded (5 of 5)]
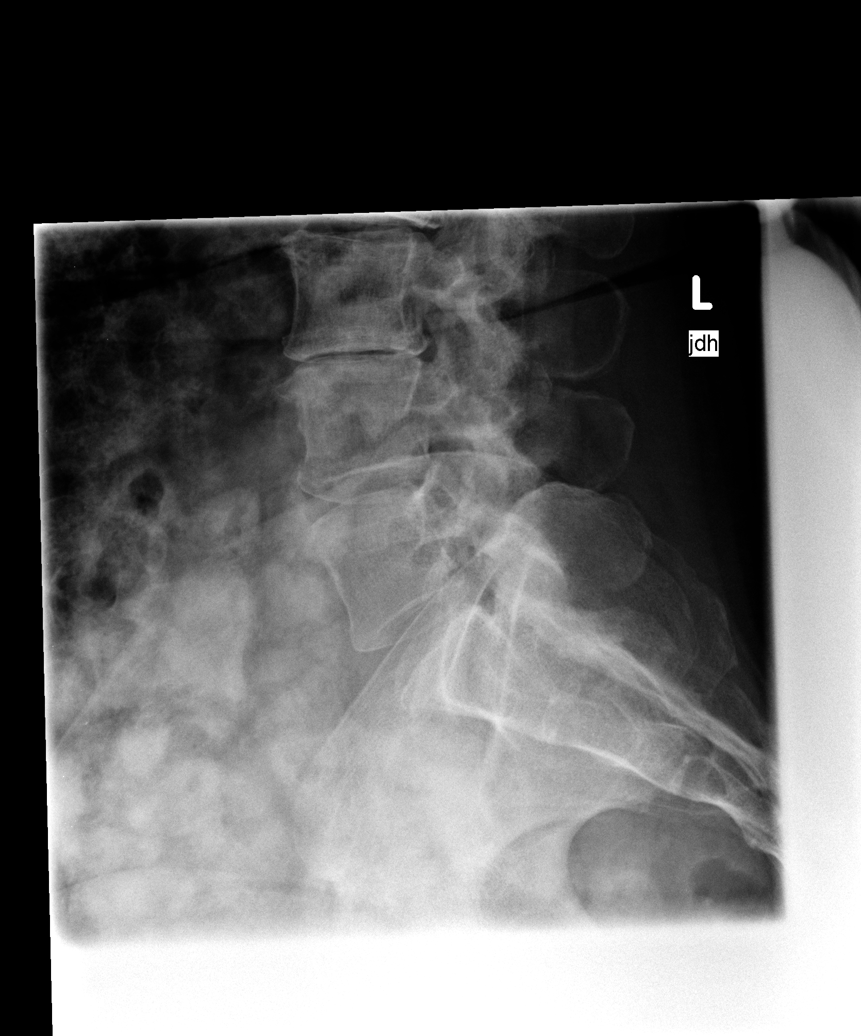

[5 of 5 positions shown; findings below may reference images not displayed]

FINDINGS: Mild levoconvex curvature of the lumbar spine may be
positional.  Alignment is otherwise anatomic.  Vertebral body
height is maintained.  Endplate degenerative changes are seen from
L2-3 to L4-5.  Findings are worst at L3-4, where there is loss of
disc space height, endplate sclerosis, marginal osteophytosis and
facet hypertrophy.  No definite pars defects.
IMPRESSION: Spondylosis, worst at L3-4.

## 2014-08-19 DIAGNOSIS — Z803 Family history of malignant neoplasm of breast: Secondary | ICD-10-CM | POA: Diagnosis not present

## 2014-08-19 DIAGNOSIS — Z1231 Encounter for screening mammogram for malignant neoplasm of breast: Secondary | ICD-10-CM | POA: Diagnosis not present

## 2014-08-25 DIAGNOSIS — E7800 Pure hypercholesterolemia, unspecified: Secondary | ICD-10-CM | POA: Diagnosis not present

## 2014-08-25 DIAGNOSIS — E039 Hypothyroidism, unspecified: Secondary | ICD-10-CM | POA: Diagnosis not present

## 2014-08-25 DIAGNOSIS — R82998 Other abnormal findings in urine: Secondary | ICD-10-CM | POA: Diagnosis not present

## 2014-09-01 DIAGNOSIS — Z23 Encounter for immunization: Secondary | ICD-10-CM | POA: Diagnosis not present

## 2014-09-01 DIAGNOSIS — M25569 Pain in unspecified knee: Secondary | ICD-10-CM | POA: Diagnosis not present

## 2014-09-01 DIAGNOSIS — Z0000 Encounter for general adult medical examination without abnormal findings: Secondary | ICD-10-CM | POA: Diagnosis not present

## 2014-09-01 DIAGNOSIS — E7800 Pure hypercholesterolemia, unspecified: Secondary | ICD-10-CM | POA: Diagnosis not present

## 2014-09-01 DIAGNOSIS — K219 Gastro-esophageal reflux disease without esophagitis: Secondary | ICD-10-CM | POA: Diagnosis not present

## 2014-09-01 DIAGNOSIS — E039 Hypothyroidism, unspecified: Secondary | ICD-10-CM | POA: Diagnosis not present

## 2014-09-01 DIAGNOSIS — Z1331 Encounter for screening for depression: Secondary | ICD-10-CM | POA: Diagnosis not present

## 2014-09-01 DIAGNOSIS — R82998 Other abnormal findings in urine: Secondary | ICD-10-CM | POA: Diagnosis not present

## 2014-09-01 DIAGNOSIS — K1370 Unspecified lesions of oral mucosa: Secondary | ICD-10-CM | POA: Diagnosis not present

## 2014-09-02 DIAGNOSIS — Z1212 Encounter for screening for malignant neoplasm of rectum: Secondary | ICD-10-CM | POA: Diagnosis not present

## 2014-09-21 ENCOUNTER — Other Ambulatory Visit: Payer: Self-pay | Admitting: Obstetrics and Gynecology

## 2014-09-21 DIAGNOSIS — Z124 Encounter for screening for malignant neoplasm of cervix: Secondary | ICD-10-CM | POA: Diagnosis not present

## 2014-09-21 DIAGNOSIS — N9089 Other specified noninflammatory disorders of vulva and perineum: Secondary | ICD-10-CM | POA: Diagnosis not present

## 2014-09-21 DIAGNOSIS — L940 Localized scleroderma [morphea]: Secondary | ICD-10-CM | POA: Diagnosis not present

## 2014-09-22 LAB — CYTOLOGY - PAP

## 2014-10-16 DIAGNOSIS — Z23 Encounter for immunization: Secondary | ICD-10-CM | POA: Diagnosis not present

## 2015-02-10 DIAGNOSIS — D3709 Neoplasm of uncertain behavior of other specified sites of the oral cavity: Secondary | ICD-10-CM | POA: Diagnosis not present

## 2015-02-16 DIAGNOSIS — I779 Disorder of arteries and arterioles, unspecified: Secondary | ICD-10-CM | POA: Diagnosis not present

## 2015-02-16 DIAGNOSIS — K1321 Leukoplakia of oral mucosa, including tongue: Secondary | ICD-10-CM | POA: Diagnosis not present

## 2015-02-17 DIAGNOSIS — L821 Other seborrheic keratosis: Secondary | ICD-10-CM | POA: Diagnosis not present

## 2015-02-17 DIAGNOSIS — D2261 Melanocytic nevi of right upper limb, including shoulder: Secondary | ICD-10-CM | POA: Diagnosis not present

## 2015-02-17 DIAGNOSIS — L309 Dermatitis, unspecified: Secondary | ICD-10-CM | POA: Diagnosis not present

## 2015-02-17 DIAGNOSIS — L814 Other melanin hyperpigmentation: Secondary | ICD-10-CM | POA: Diagnosis not present

## 2015-02-17 DIAGNOSIS — D1801 Hemangioma of skin and subcutaneous tissue: Secondary | ICD-10-CM | POA: Diagnosis not present

## 2015-04-05 ENCOUNTER — Emergency Department (HOSPITAL_COMMUNITY): Payer: Medicare Other

## 2015-04-05 ENCOUNTER — Encounter (HOSPITAL_COMMUNITY): Payer: Self-pay | Admitting: Emergency Medicine

## 2015-04-05 ENCOUNTER — Emergency Department (HOSPITAL_COMMUNITY)
Admission: EM | Admit: 2015-04-05 | Discharge: 2015-04-05 | Disposition: A | Discharge status: Home / Self Care | Payer: Medicare Other | Attending: Emergency Medicine | Admitting: Emergency Medicine

## 2015-04-05 DIAGNOSIS — R05 Cough: Secondary | ICD-10-CM | POA: Diagnosis not present

## 2015-04-05 DIAGNOSIS — Z79899 Other long term (current) drug therapy: Secondary | ICD-10-CM | POA: Insufficient documentation

## 2015-04-05 DIAGNOSIS — R0602 Shortness of breath: Secondary | ICD-10-CM | POA: Diagnosis not present

## 2015-04-05 DIAGNOSIS — G43909 Migraine, unspecified, not intractable, without status migrainosus: Secondary | ICD-10-CM | POA: Insufficient documentation

## 2015-04-05 DIAGNOSIS — E039 Hypothyroidism, unspecified: Secondary | ICD-10-CM | POA: Insufficient documentation

## 2015-04-05 DIAGNOSIS — Z86018 Personal history of other benign neoplasm: Secondary | ICD-10-CM | POA: Diagnosis not present

## 2015-04-05 DIAGNOSIS — Z8719 Personal history of other diseases of the digestive system: Secondary | ICD-10-CM | POA: Diagnosis not present

## 2015-04-05 DIAGNOSIS — R072 Precordial pain: Secondary | ICD-10-CM | POA: Diagnosis not present

## 2015-04-05 DIAGNOSIS — R079 Chest pain, unspecified: Secondary | ICD-10-CM

## 2015-04-05 LAB — CBC WITH DIFFERENTIAL/PLATELET
BASOS PCT: 1 % (ref 0–1)
Basophils Absolute: 0 10*3/uL (ref 0.0–0.1)
Eosinophils Absolute: 0.3 10*3/uL (ref 0.0–0.7)
Eosinophils Relative: 3 % (ref 0–5)
HEMATOCRIT: 41.4 % (ref 36.0–46.0)
HEMOGLOBIN: 13.9 g/dL (ref 12.0–15.0)
LYMPHS ABS: 3.4 10*3/uL (ref 0.7–4.0)
LYMPHS PCT: 46 % (ref 12–46)
MCH: 31.6 pg (ref 26.0–34.0)
MCHC: 33.6 g/dL (ref 30.0–36.0)
MCV: 94.1 fL (ref 78.0–100.0)
MONO ABS: 0.5 10*3/uL (ref 0.1–1.0)
MONOS PCT: 7 % (ref 3–12)
NEUTROS ABS: 3.2 10*3/uL (ref 1.7–7.7)
Neutrophils Relative %: 43 % (ref 43–77)
Platelets: 237 10*3/uL (ref 150–400)
RBC: 4.4 MIL/uL (ref 3.87–5.11)
RDW: 12.9 % (ref 11.5–15.5)
WBC: 7.4 10*3/uL (ref 4.0–10.5)

## 2015-04-05 LAB — BASIC METABOLIC PANEL
Anion gap: 12 (ref 5–15)
BUN: 15 mg/dL (ref 6–23)
CALCIUM: 9.2 mg/dL (ref 8.4–10.5)
CHLORIDE: 103 mmol/L (ref 96–112)
CO2: 25 mmol/L (ref 19–32)
CREATININE: 0.85 mg/dL (ref 0.50–1.10)
GFR calc non Af Amer: 66 mL/min — ABNORMAL LOW (ref >90)
GFR, EST AFRICAN AMERICAN: 76 mL/min — AB (ref >90)
Glucose, Bld: 91 mg/dL (ref 70–99)
POTASSIUM: 3.7 mmol/L (ref 3.5–5.1)
Sodium: 140 mmol/L (ref 135–145)

## 2015-04-05 LAB — I-STAT TROPONIN, ED
TROPONIN I, POC: 0 ng/mL (ref 0.00–0.08)
Troponin i, poc: 0 ng/mL (ref 0.00–0.08)

## 2015-04-05 MED ORDER — ASPIRIN 81 MG PO CHEW
243.0000 mg | CHEWABLE_TABLET | Freq: Once | ORAL | Status: AC
  Administered 2015-04-05: 243 mg via ORAL
  Filled 2015-04-05: qty 3

## 2015-04-05 NOTE — Discharge Instructions (Signed)

## 2015-04-05 NOTE — ED Notes (Signed)
Pt c/o mid sternal CP and pressure starting today; pt denies SOB

## 2015-04-05 NOTE — ED Provider Notes (Signed)
CSN: [id number]     Arrival date & time 04/05/15  1433 History   First MD Initiated Contact with Patient 04/05/15 1602     Chief Complaint  Patient presents with  . Chest Pain    Patient is a 75 y.o. female presenting with chest pain. The history is provided by the patient.  Chest Pain Pain location:  Substernal area and L chest Pain quality: pressure   Pain radiates to:  Does not radiate Pain radiates to the back: no   Pain severity:  Mild Onset quality:  Gradual Duration: "all day" Timing:  Constant Progression:  Improving Chronicity:  Recurrent Relieved by:  Nothing Worsened by:  Nothing tried Associated symptoms: no abdominal pain, no diaphoresis, no dizziness, no fatigue, no fever, no lower extremity edema, no nausea, no shortness of breath, no syncope, not vomiting and no weakness   Risk factors: no coronary artery disease, no diabetes mellitus, no high cholesterol, no hypertension, no prior DVT/PE and no smoking   Pt reports chest pressure "all day" Started when she woke up No associated symptoms No fatigue/weakness with exertion or activity.   She has had this pressure on/off for weeks at random times but today's episode lasted the longest No h/o exertional CP She can go about daily activities without difficulty She reports "normal stress test" several yrs ago No h/o CAD Pt took 1 ASA today    Fam hx: + for CAD Past Medical History  Diagnosis Date  . Hypothyroidism   . Hemorrhoids   . Migraine headache   . Personal history of colonic adenoma 05/15/2013   Past Surgical History  Procedure Laterality Date  . Thyroid surgery  15 years ago  . Cholecystectomy  04/04/2012    Procedure: LAPAROSCOPIC CHOLECYSTECTOMY WITH INTRAOPERATIVE CHOLANGIOGRAM;  Surgeon: Zenovia Jarred, MD;  Location: [facility];  Service: General;  Laterality: N/A;  . Cataracts      Dr. Zadie Rhine  . Colonoscopy     Family History  Problem Relation Age of Onset  . Coronary artery disease Father    . Diabetes Sister   . Coronary artery disease Mother   . Coronary artery disease Brother   . Diabetes Brother    History  Substance Use Topics  . Smoking status: Never Smoker   . Smokeless tobacco: Never Used  . Alcohol Use: No   OB History    No data available     Review of Systems  Constitutional: Negative for fever, diaphoresis and fatigue.  Respiratory: Negative for shortness of breath.   Cardiovascular: Positive for chest pain. Negative for leg swelling and syncope.  Gastrointestinal: Negative for nausea, vomiting and abdominal pain.  Neurological: Negative for dizziness, syncope and weakness.  All other systems reviewed and are negative.     Allergies  Levofloxacin  Home Medications   Prior to Admission medications   Medication Sig Start Date End Date Taking? Authorizing Provider  BIOTIN PO Take 1 tablet by mouth daily.   Yes Historical Provider, MD  clobetasol ointment (TEMOVATE) 8.84 % Apply 1 application topically daily as needed (skin irritation). Apply to area as needed.   Yes Historical Provider, MD  Docusate Calcium (STOOL SOFTENER PO) Take 1 tablet by mouth daily as needed (stool softener).    Yes Historical Provider, MD  fluticasone (FLONASE) 50 MCG/ACT nasal spray Place 2 sprays into the nose daily as needed for allergies or rhinitis.    Yes Historical Provider, MD  levothyroxine (SYNTHROID, LEVOTHROID) 112 MCG tablet Take 56  mcg by mouth daily.    Yes Historical Provider, MD  SUMAtriptan (IMITREX) 100 MG tablet Take 100 mg by mouth every 2 (two) hours as needed. For headache   Yes Historical Provider, MD  venlafaxine (EFFEXOR) 75 MG tablet Take 75 mg by mouth daily.   Yes Historical Provider, MD  glycopyrrolate (ROBINUL-FORTE) 2 MG tablet Take 1 tablet once or twice daily as needed for abdominal pain or spasm Patient not taking: Reported on 04/05/2015 05/05/13   Amy S Esterwood, PA-C   BP 126/77 mmHg  Pulse 75  Temp(Src) 98.2 F (36.8 C) (Oral)  Resp 17   Ht 5\' 5"  (1.651 m)  Wt 150 lb (68.04 kg)  BMI 24.96 kg/m2  SpO2 95% Physical Exam CONSTITUTIONAL: Well developed/well nourished HEAD: Normocephalic/atraumatic EYES: EOMI/PERRL ENMT: Mucous membranes moist NECK: supple no meningeal signs SPINE/BACK:entire spine nontender CV: S1/S2 noted, no murmurs/rubs/gallops noted Chest - mild tenderness to left chest, no crepitus LUNGS: Lungs are clear to auscultation bilaterally, no apparent distress ABDOMEN: soft, nontender, no rebound or guarding, bowel sounds noted throughout abdomen GU:no cva tenderness NEURO: Pt is awake/alert/appropriate, moves all extremitiesx4.  No facial droop.   EXTREMITIES: pulses normal/equal, full ROM, no LE edema SKIN: warm, color normal PSYCH: no abnormalities of mood noted, alert and oriented to situation  ED Course  Procedures  Medications  aspirin chewable tablet 243 mg (243 mg Oral Given 04/05/15 1623)      4:34 PM Pt well appearing Initial workup unremarkable I doubt PE/Dissection at this time HEART score >3, but after discussion pt prefers to go home after repeat troponin to f/u with PCP Repeat EKG unchanged and no ischemic changes 4:55 PM Discussed again limitations of initial testing, but pt would still like to go home and f/u with PCP.   6:35 PM Repeat troponin negative Advised pt f/u with PCP BP 132/74 mmHg  Pulse 74  Temp(Src) 98.2 F (36.8 C) (Oral)  Resp 16  Ht 5\' 5"  (1.651 m)  Wt 150 lb (68.04 kg)  BMI 24.96 kg/m2  SpO2 100%  Labs Review Labs Reviewed  BASIC METABOLIC PANEL - Abnormal; Notable for the following:    GFR calc non Af Amer 66 (*)    GFR calc Af Amer 76 (*)    All other components within normal limits  CBC WITH DIFFERENTIAL/PLATELET  Randolm Idol, ED    Imaging Review Dg Chest 2 View  04/05/2015   CLINICAL DATA:  Central chest pain 730 this morning. No cough or shortness of breath. Family history of heart disease. Nonsmoker.  EXAM: CHEST  2 VIEW   COMPARISON:  11/21/2005  FINDINGS: Heart size is normal. Lungs are clear. No pulmonary edema. Surgical clips are present in the right upper quadrant of the abdomen.  IMPRESSION: No active cardiopulmonary disease.   Electronically Signed   By: Nolon Nations M.D.   On: 04/05/2015 15:18     EKG Interpretation   Date/Time:  Monday April 05 2015 14:41:36 EDT Ventricular Rate:  77 PR Interval:  152 QRS Duration: 78 QT Interval:  382 QTC Calculation: 432 R Axis:   40 Text Interpretation:  Normal sinus rhythm Low voltage QRS Cannot rule out  Anterior infarct , age undetermined Abnormal ECG No significant change  since last tracing Confirmed by Christy Gentles  MD, [place] ([id number]) on 04/05/2015  4:02:53 PM      EKG Interpretation  Date/Time:  Monday April 05 2015 16:24:44 EDT Ventricular Rate:  80 PR Interval:  158 QRS Duration: 90  QT Interval:  387 QTC Calculation: 446 R Axis:   12 Text Interpretation:  Sinus rhythm Low voltage, precordial leads artifact noted No significant change since last tracing Confirmed by Christy Gentles  MD, Elenore Rota ([id number]) on 04/05/2015 4:31:19 PM        MDM   Final diagnoses:  Chest pain, unspecified chest pain type    Nursing notes including past medical history and social history reviewed and considered in documentation Labs/vital reviewed myself and considered during evaluation xrays/imaging reviewed by myself and considered during evaluation     Ripley Fraise, MD 04/05/15 1835

## 2015-04-05 NOTE — ED Notes (Signed)
Pt stable, ambulatory, denies any pain at this time.

## 2015-04-09 DIAGNOSIS — Z8249 Family history of ischemic heart disease and other diseases of the circulatory system: Secondary | ICD-10-CM | POA: Diagnosis not present

## 2015-04-09 DIAGNOSIS — F329 Major depressive disorder, single episode, unspecified: Secondary | ICD-10-CM | POA: Diagnosis not present

## 2015-04-09 DIAGNOSIS — R0789 Other chest pain: Secondary | ICD-10-CM | POA: Diagnosis not present

## 2015-04-09 DIAGNOSIS — K219 Gastro-esophageal reflux disease without esophagitis: Secondary | ICD-10-CM | POA: Diagnosis not present

## 2015-04-09 DIAGNOSIS — Z6826 Body mass index (BMI) 26.0-26.9, adult: Secondary | ICD-10-CM | POA: Diagnosis not present

## 2015-04-09 DIAGNOSIS — E780 Pure hypercholesterolemia: Secondary | ICD-10-CM | POA: Diagnosis not present

## 2015-04-21 DIAGNOSIS — L900 Lichen sclerosus et atrophicus: Secondary | ICD-10-CM | POA: Diagnosis not present

## 2015-04-21 DIAGNOSIS — L245 Irritant contact dermatitis due to other chemical products: Secondary | ICD-10-CM | POA: Diagnosis not present

## 2015-04-21 DIAGNOSIS — L309 Dermatitis, unspecified: Secondary | ICD-10-CM | POA: Diagnosis not present

## 2015-06-08 DIAGNOSIS — H906 Mixed conductive and sensorineural hearing loss, bilateral: Secondary | ICD-10-CM | POA: Diagnosis not present

## 2015-06-08 DIAGNOSIS — H6061 Unspecified chronic otitis externa, right ear: Secondary | ICD-10-CM | POA: Diagnosis not present

## 2015-06-21 ENCOUNTER — Other Ambulatory Visit: Payer: Self-pay

## 2015-06-30 DIAGNOSIS — N898 Other specified noninflammatory disorders of vagina: Secondary | ICD-10-CM | POA: Diagnosis not present

## 2015-07-01 DIAGNOSIS — H6061 Unspecified chronic otitis externa, right ear: Secondary | ICD-10-CM | POA: Diagnosis not present

## 2015-07-01 DIAGNOSIS — H906 Mixed conductive and sensorineural hearing loss, bilateral: Secondary | ICD-10-CM | POA: Diagnosis not present

## 2015-07-07 DIAGNOSIS — H16103 Unspecified superficial keratitis, bilateral: Secondary | ICD-10-CM | POA: Diagnosis not present

## 2015-07-07 DIAGNOSIS — H31003 Unspecified chorioretinal scars, bilateral: Secondary | ICD-10-CM | POA: Diagnosis not present

## 2015-07-07 DIAGNOSIS — Z961 Presence of intraocular lens: Secondary | ICD-10-CM | POA: Diagnosis not present

## 2015-07-07 DIAGNOSIS — H04123 Dry eye syndrome of bilateral lacrimal glands: Secondary | ICD-10-CM | POA: Diagnosis not present

## 2015-10-25 DIAGNOSIS — N390 Urinary tract infection, site not specified: Secondary | ICD-10-CM | POA: Diagnosis not present

## 2015-10-25 DIAGNOSIS — R8299 Other abnormal findings in urine: Secondary | ICD-10-CM | POA: Diagnosis not present

## 2015-10-25 DIAGNOSIS — E039 Hypothyroidism, unspecified: Secondary | ICD-10-CM | POA: Diagnosis not present

## 2015-10-25 DIAGNOSIS — E7800 Pure hypercholesterolemia, unspecified: Secondary | ICD-10-CM | POA: Diagnosis not present

## 2015-11-01 DIAGNOSIS — E7800 Pure hypercholesterolemia, unspecified: Secondary | ICD-10-CM | POA: Diagnosis not present

## 2015-11-01 DIAGNOSIS — R0789 Other chest pain: Secondary | ICD-10-CM | POA: Diagnosis not present

## 2015-11-01 DIAGNOSIS — Z8249 Family history of ischemic heart disease and other diseases of the circulatory system: Secondary | ICD-10-CM | POA: Diagnosis not present

## 2015-11-01 DIAGNOSIS — K219 Gastro-esophageal reflux disease without esophagitis: Secondary | ICD-10-CM | POA: Diagnosis not present

## 2015-11-01 DIAGNOSIS — N390 Urinary tract infection, site not specified: Secondary | ICD-10-CM | POA: Diagnosis not present

## 2015-11-01 DIAGNOSIS — M545 Low back pain: Secondary | ICD-10-CM | POA: Diagnosis not present

## 2015-11-01 DIAGNOSIS — K635 Polyp of colon: Secondary | ICD-10-CM | POA: Diagnosis not present

## 2015-11-01 DIAGNOSIS — Z0000 Encounter for general adult medical examination without abnormal findings: Secondary | ICD-10-CM | POA: Diagnosis not present

## 2015-11-01 DIAGNOSIS — Z1389 Encounter for screening for other disorder: Secondary | ICD-10-CM | POA: Diagnosis not present

## 2015-11-01 DIAGNOSIS — R030 Elevated blood-pressure reading, without diagnosis of hypertension: Secondary | ICD-10-CM | POA: Diagnosis not present

## 2015-11-01 DIAGNOSIS — G43909 Migraine, unspecified, not intractable, without status migrainosus: Secondary | ICD-10-CM | POA: Diagnosis not present

## 2015-11-01 DIAGNOSIS — Z6826 Body mass index (BMI) 26.0-26.9, adult: Secondary | ICD-10-CM | POA: Diagnosis not present

## 2015-11-16 DIAGNOSIS — Z1231 Encounter for screening mammogram for malignant neoplasm of breast: Secondary | ICD-10-CM | POA: Diagnosis not present

## 2015-11-16 DIAGNOSIS — Z803 Family history of malignant neoplasm of breast: Secondary | ICD-10-CM | POA: Diagnosis not present

## 2016-03-24 DIAGNOSIS — H9193 Unspecified hearing loss, bilateral: Secondary | ICD-10-CM | POA: Diagnosis not present

## 2016-03-24 DIAGNOSIS — J301 Allergic rhinitis due to pollen: Secondary | ICD-10-CM | POA: Diagnosis not present

## 2016-03-24 DIAGNOSIS — Z6826 Body mass index (BMI) 26.0-26.9, adult: Secondary | ICD-10-CM | POA: Diagnosis not present

## 2016-03-24 DIAGNOSIS — R1310 Dysphagia, unspecified: Secondary | ICD-10-CM | POA: Diagnosis not present

## 2016-04-05 DIAGNOSIS — L821 Other seborrheic keratosis: Secondary | ICD-10-CM | POA: Diagnosis not present

## 2016-04-05 DIAGNOSIS — D1801 Hemangioma of skin and subcutaneous tissue: Secondary | ICD-10-CM | POA: Diagnosis not present

## 2016-04-05 DIAGNOSIS — L918 Other hypertrophic disorders of the skin: Secondary | ICD-10-CM | POA: Diagnosis not present

## 2016-04-05 DIAGNOSIS — L700 Acne vulgaris: Secondary | ICD-10-CM | POA: Diagnosis not present

## 2016-04-05 DIAGNOSIS — D225 Melanocytic nevi of trunk: Secondary | ICD-10-CM | POA: Diagnosis not present

## 2016-04-05 DIAGNOSIS — D2261 Melanocytic nevi of right upper limb, including shoulder: Secondary | ICD-10-CM | POA: Diagnosis not present

## 2016-04-05 DIAGNOSIS — D2272 Melanocytic nevi of left lower limb, including hip: Secondary | ICD-10-CM | POA: Diagnosis not present

## 2016-04-05 DIAGNOSIS — L738 Other specified follicular disorders: Secondary | ICD-10-CM | POA: Diagnosis not present

## 2016-05-02 DIAGNOSIS — Z6826 Body mass index (BMI) 26.0-26.9, adult: Secondary | ICD-10-CM | POA: Diagnosis not present

## 2016-05-02 DIAGNOSIS — M25562 Pain in left knee: Secondary | ICD-10-CM | POA: Diagnosis not present

## 2016-05-02 DIAGNOSIS — E038 Other specified hypothyroidism: Secondary | ICD-10-CM | POA: Diagnosis not present

## 2016-05-10 DIAGNOSIS — H6061 Unspecified chronic otitis externa, right ear: Secondary | ICD-10-CM | POA: Diagnosis not present

## 2016-05-10 DIAGNOSIS — H7311 Chronic myringitis, right ear: Secondary | ICD-10-CM | POA: Diagnosis not present

## 2016-05-10 DIAGNOSIS — H903 Sensorineural hearing loss, bilateral: Secondary | ICD-10-CM | POA: Diagnosis not present

## 2016-05-16 DIAGNOSIS — M1712 Unilateral primary osteoarthritis, left knee: Secondary | ICD-10-CM | POA: Diagnosis not present

## 2016-05-16 DIAGNOSIS — M7122 Synovial cyst of popliteal space [Baker], left knee: Secondary | ICD-10-CM | POA: Diagnosis not present

## 2016-05-16 DIAGNOSIS — M2242 Chondromalacia patellae, left knee: Secondary | ICD-10-CM | POA: Diagnosis not present

## 2016-05-16 DIAGNOSIS — M25562 Pain in left knee: Secondary | ICD-10-CM | POA: Diagnosis not present

## 2016-05-24 ENCOUNTER — Encounter: Payer: Self-pay | Admitting: Gastroenterology

## 2016-05-26 ENCOUNTER — Ambulatory Visit (INDEPENDENT_AMBULATORY_CARE_PROVIDER_SITE_OTHER): Payer: Medicare Other | Admitting: Internal Medicine

## 2016-05-26 ENCOUNTER — Encounter: Payer: Self-pay | Admitting: Internal Medicine

## 2016-05-26 VITALS — BP 120/80 | HR 80 | Ht 64.0 in | Wt 154.2 lb

## 2016-05-26 DIAGNOSIS — R1310 Dysphagia, unspecified: Secondary | ICD-10-CM

## 2016-05-26 NOTE — Progress Notes (Signed)
   Subjective:    Patient ID: [name], female    DOB: [birth year], 75 y.o.   MRN: [id number] Cc: swallowing problems HPI Intermittent solid + liquid dysphagia (mostly solid) w/ suprasternal sticking point x several months at least. Waits and it passes usueally. No weight loss. Has had to regurgitate food at times. Rice was last food that caused it but beef, chicken and bread do also. Dentures not satisfactorily - she thinks they don't fit correctly but dentist says they do. She tried a PPI for a few weeks but no difference and stopped. Prefers not to take medication. No heartburn, no abdominal pain.   Last seen 05/08/13 - colonoscopy - adenoma, hemorrhoids Medications, allergies, past medical history, past surgical history, family history and social history are reviewed and updated in the EMR.  Review of Systems As above, + tinnitus, decreased hearing, insomnia, all other ROs negative    Objective:   Physical Exam @BP  120/80 mmHg  Pulse 80  Ht 5\' 4"  (1.626 m)  Wt 154 lb 3.2 oz (69.945 kg)  BMI 26.46 kg/m2@  General:  Well-developed, well-nourished and in no acute distress Eyes:  anicteric. ENT:   Upper and lower dentures Neck:   supple w/o thyromegaly or mass.  Lungs: Clear to auscultation bilaterally. Heart:  S1S2, no rubs, murmurs, gallops. Lymph:  no cervical or supraclavicular adenopathy. Extremities:   no edema, cyanosis or clubbing Neuro:  A&O x 3.  Psych:  appropriate mood and  Affect.   Data Reviewed: As per HPI PCP note 03/28/16 Wt Readings from Last 3 Encounters:  05/26/16 154 lb 3.2 oz (69.945 kg)  04/05/15 150 lb (68.04 kg)  05/08/13 148 lb (67.132 kg)      Assessment & Plan:   Encounter Diagnosis  Name Primary?  Marland Kitchen Dysphagia Yes   A new problem.  Sounds most like peptic stricture or ring. Esophageal dysmotility also possible as is cancer but latter especially less likely. Plan for EGD and l;ikely esophageal dilation. The risks and benefits as well  as alternatives of endoscopic procedure(s) have been discussed and reviewed. All questions answered. The patient agrees to proceed. I reviewed images that explain endoscopy and esophageal dilation with her as well.  I appreciate the opportunity to care for this patient. [id number] R, MD Toy Baker, NP

## 2016-05-26 NOTE — Patient Instructions (Signed)
   You have been scheduled for an endoscopy. Please follow written instructions given to you at your visit today. If you use inhalers (even only as needed), please bring them with you on the day of your procedure. Your physician has requested that you go to www.startemmi.com and enter the access code given to you at your visit today. This web site gives a general overview about your procedure. However, you should still follow specific instructions given to you by our office regarding your preparation for the procedure.   Chew your food very carefully until your procedure.    I appreciate the opportunity to care for you. Silvano Rusk, MD, [facility]

## 2016-07-06 ENCOUNTER — Encounter: Payer: Self-pay | Admitting: Internal Medicine

## 2016-07-06 ENCOUNTER — Ambulatory Visit (AMBULATORY_SURGERY_CENTER): Payer: Medicare Other | Admitting: Internal Medicine

## 2016-07-06 VITALS — BP 126/80 | HR 71 | Temp 98.2°F | Resp 19 | Ht 64.0 in | Wt 154.0 lb

## 2016-07-06 DIAGNOSIS — K222 Esophageal obstruction: Secondary | ICD-10-CM | POA: Diagnosis not present

## 2016-07-06 DIAGNOSIS — R1310 Dysphagia, unspecified: Secondary | ICD-10-CM

## 2016-07-06 DIAGNOSIS — F419 Anxiety disorder, unspecified: Secondary | ICD-10-CM | POA: Diagnosis not present

## 2016-07-06 MED ORDER — SODIUM CHLORIDE 0.9 % IV SOLN
500.0000 mL | INTRAVENOUS | Status: DC
Start: 2016-07-06 — End: 2016-07-06

## 2016-07-06 NOTE — Op Note (Signed)
[place] Patient Name: [name] Procedure Date: 07/06/2016 10:30 AM MRN: [id number] Endoscopist: Gatha Mayer , MD Age: 75 Referring MD:  Date of Birth: 02-Aug-1941 Gender: Female Account #: [id number] Procedure:                Upper GI endoscopy Indications:              Dysphagia Medicines:                Propofol per Anesthesia, Monitored Anesthesia Care Procedure:                Pre-Anesthesia Assessment:                           - Prior to the procedure, a History and Physical                            was performed, and patient medications and                            allergies were reviewed. The patient's tolerance of                            previous anesthesia was also reviewed. The risks                            and benefits of the procedure and the sedation                            options and risks were discussed with the patient.                            All questions were answered, and informed consent                            was obtained. Prior Anticoagulants: The patient has                            taken no previous anticoagulant or antiplatelet                            agents. ASA Grade Assessment: II - A patient with                            mild systemic disease. After reviewing the risks                            and benefits, the patient was deemed in                            satisfactory condition to undergo the procedure.                           After obtaining informed consent, the endoscope was  passed under direct vision. Throughout the                            procedure, the patient's blood pressure, pulse, and                            oxygen saturations were monitored continuously. The                            Model GIF-HQ190 [id number] scope was introduced                            through the mouth, and advanced to the second part                            of duodenum. The  upper GI endoscopy was                            accomplished without difficulty. The patient                            tolerated the procedure well. Scope In: Scope Out: Findings:                 One moderate (circumferential scarring or stenosis;                            an endoscope may pass) benign-appearing, intrinsic                            stenosis was found at the gastroesophageal                            junction. And was traversed. The scope was                            withdrawn. Dilation was performed with a Maloney                            dilator with no resistance at 53 Fr. Estimated                            blood loss: none.                           The exam was otherwise without abnormality.                           The cardia and gastric fundus were normal on                            retroflexion. Complications:            No immediate complications. Estimated blood loss:  None. Estimated Blood Loss:     Estimated blood loss: none. Impression:               - Benign-appearing esophageal stenosis. Dilated.                            Mild-moderate ring seen                           - The examination was otherwise normal.                           - No specimens collected. Recommendation:           - Patient has a contact number available for                            emergencies. The signs and symptoms of potential                            delayed complications were discussed with the                            patient. Return to normal activities tomorrow.                            Written discharge instructions were provided to the                            patient.                           - Clear liquids x 1 hour then soft foods rest of                            day. Start prior diet tomorrow.                           - Continue present medications.                           - She prefers not to take medication  but if                            dysphagia resolves and returns a PPI would be                            appropriate. Gatha Mayer, MD 07/06/2016 11:01:23 AM This report has been signed electronically.

## 2016-07-06 NOTE — Progress Notes (Signed)
A/ox3 pleased with MAC, report to April RN 

## 2016-07-06 NOTE — Patient Instructions (Addendum)
I dilated the esophagus- I hope it helps.  If it does not please let me know.  I am not starting acid-blocking medication at this time but if the problems return that will be a likely recommendation.  No signs of cancer.  I appreciate the opportunity to care for you. Gatha Mayer, MD, FACG  YOU HAD AN ENDOSCOPIC PROCEDURE TODAY AT [facility]:   Refer to the procedure report that was given to you for any specific questions about what was found during the examination.  If the procedure report does not answer your questions, please call your gastroenterologist to clarify.  If you requested that your care partner not be given the details of your procedure findings, then the procedure report has been included in a sealed envelope for you to review at your convenience later.  YOU SHOULD EXPECT: Some feelings of bloating in the abdomen. Passage of more gas than usual.  Walking can help get rid of the air that was put into your GI tract during the procedure and reduce the bloating. If you had a lower endoscopy (such as a colonoscopy or flexible sigmoidoscopy) you may notice spotting of blood in your stool or on the toilet paper. If you underwent a bowel prep for your procedure, you may not have a normal bowel movement for a few days.  Please Note:  You might notice some irritation and congestion in your nose or some drainage.  This is from the oxygen used during your procedure.  There is no need for concern and it should clear up in a day or so.  SYMPTOMS TO REPORT IMMEDIATELY:    Following upper endoscopy (EGD)  Vomiting of blood or coffee ground material  New chest pain or pain under the shoulder blades  Painful or persistently difficult swallowing  New shortness of breath  Fever of 100F or higher  Black, tarry-looking stools  For urgent or emergent issues, a gastroenterologist can be reached at any hour by calling [phone].   DIET: see dilation diet  handout-  Drink plenty of fluids but you should avoid alcoholic beverages for 24 hours.  ACTIVITY:  You should plan to take it easy for the rest of today and you should NOT DRIVE or use heavy machinery until tomorrow (because of the sedation medicines used during the test).    FOLLOW UP: Our staff will call the number listed on your records the next business day following your procedure to check on you and address any questions or concerns that you may have regarding the information given to you following your procedure. If we do not reach you, we will leave a message.  However, if you are feeling well and you are not experiencing any problems, there is no need to return our call.  We will assume that you have returned to your regular daily activities without incident.  If any biopsies were taken you will be contacted by phone or by letter within the next 1-3 weeks.  Please call us at [phone] if you have not heard about the biopsies in 3 weeks.    SIGNATURES/CONFIDENTIALITY: You and/or your care partner have signed paperwork which will be entered into your electronic medical record.  These signatures attest to the fact that that the information above on your After Visit Summary has been reviewed and is understood.  Full responsibility of the confidentiality of this discharge information lies with you and/or your care-partner.  Dilation diet, stricture-handouts given

## 2016-07-06 NOTE — Progress Notes (Signed)
Called to room to assist during endoscopic procedure.  Patient ID and intended procedure confirmed with present staff. Received instructions for my participation in the procedure from the performing physician.  

## 2016-07-07 ENCOUNTER — Telehealth: Payer: Self-pay

## 2016-07-07 DIAGNOSIS — M2242 Chondromalacia patellae, left knee: Secondary | ICD-10-CM | POA: Diagnosis not present

## 2016-07-07 DIAGNOSIS — M7122 Synovial cyst of popliteal space [Baker], left knee: Secondary | ICD-10-CM | POA: Diagnosis not present

## 2016-07-07 DIAGNOSIS — M25562 Pain in left knee: Secondary | ICD-10-CM | POA: Diagnosis not present

## 2016-07-07 DIAGNOSIS — M1712 Unilateral primary osteoarthritis, left knee: Secondary | ICD-10-CM | POA: Diagnosis not present

## 2016-07-07 NOTE — Telephone Encounter (Signed)
  Follow up Call-  Call back number 07/06/2016  Post procedure Call Back phone  # [phone]  Permission to leave phone message Yes     Patient questions:  Do you have a fever, pain , or abdominal swelling? No. Pain Score  0 *  Have you tolerated food without any problems? Yes.    Have you been able to return to your normal activities? Yes.    Do you have any questions about your discharge instructions: Diet   No. Medications  No. Follow up visit  No.  Do you have questions or concerns about your Care? No.  Actions: * If pain score is 4 or above: No action needed, pain <4.

## 2016-07-12 DIAGNOSIS — H04123 Dry eye syndrome of bilateral lacrimal glands: Secondary | ICD-10-CM | POA: Diagnosis not present

## 2016-07-12 DIAGNOSIS — Z961 Presence of intraocular lens: Secondary | ICD-10-CM | POA: Diagnosis not present

## 2016-07-12 DIAGNOSIS — H31003 Unspecified chorioretinal scars, bilateral: Secondary | ICD-10-CM | POA: Diagnosis not present

## 2016-07-12 DIAGNOSIS — H5213 Myopia, bilateral: Secondary | ICD-10-CM | POA: Diagnosis not present

## 2016-07-17 DIAGNOSIS — M1712 Unilateral primary osteoarthritis, left knee: Secondary | ICD-10-CM | POA: Diagnosis not present

## 2016-08-01 DIAGNOSIS — M545 Low back pain: Secondary | ICD-10-CM | POA: Diagnosis not present

## 2016-08-01 DIAGNOSIS — Z6826 Body mass index (BMI) 26.0-26.9, adult: Secondary | ICD-10-CM | POA: Diagnosis not present

## 2016-08-24 DIAGNOSIS — M179 Osteoarthritis of knee, unspecified: Secondary | ICD-10-CM | POA: Diagnosis not present

## 2016-08-24 DIAGNOSIS — S8392XA Sprain of unspecified site of left knee, initial encounter: Secondary | ICD-10-CM | POA: Diagnosis not present

## 2016-08-24 DIAGNOSIS — G8911 Acute pain due to trauma: Secondary | ICD-10-CM | POA: Diagnosis not present

## 2016-08-24 DIAGNOSIS — Z0389 Encounter for observation for other suspected diseases and conditions ruled out: Secondary | ICD-10-CM | POA: Diagnosis not present

## 2016-08-24 DIAGNOSIS — S8992XA Unspecified injury of left lower leg, initial encounter: Secondary | ICD-10-CM | POA: Diagnosis not present

## 2016-09-20 DIAGNOSIS — M1712 Unilateral primary osteoarthritis, left knee: Secondary | ICD-10-CM | POA: Diagnosis not present

## 2016-09-20 DIAGNOSIS — S8982XA Other specified injuries of left lower leg, initial encounter: Secondary | ICD-10-CM | POA: Diagnosis not present

## 2016-09-20 DIAGNOSIS — M25562 Pain in left knee: Secondary | ICD-10-CM | POA: Diagnosis not present

## 2016-09-20 DIAGNOSIS — M2242 Chondromalacia patellae, left knee: Secondary | ICD-10-CM | POA: Diagnosis not present

## 2016-09-27 ENCOUNTER — Ambulatory Visit (INDEPENDENT_AMBULATORY_CARE_PROVIDER_SITE_OTHER): Payer: Medicare Other

## 2016-09-27 ENCOUNTER — Ambulatory Visit (INDEPENDENT_AMBULATORY_CARE_PROVIDER_SITE_OTHER): Payer: Medicare Other | Admitting: Podiatry

## 2016-09-27 VITALS — BP 144/89 | HR 74 | Resp 18

## 2016-09-27 DIAGNOSIS — L851 Acquired keratosis [keratoderma] palmaris et plantaris: Secondary | ICD-10-CM

## 2016-09-27 DIAGNOSIS — M79672 Pain in left foot: Secondary | ICD-10-CM

## 2016-09-27 DIAGNOSIS — L84 Corns and callosities: Secondary | ICD-10-CM

## 2016-09-27 DIAGNOSIS — M79671 Pain in right foot: Secondary | ICD-10-CM | POA: Diagnosis not present

## 2016-09-27 DIAGNOSIS — B351 Tinea unguium: Secondary | ICD-10-CM

## 2016-09-27 NOTE — Progress Notes (Signed)
   Subjective:    Patient ID: [name], female    DOB: 04-15-1941, 75 y.o.   MRN: [id number]  HPI    Review of Systems  Constitutional: Positive for fatigue.  HENT: Positive for hearing loss, sore throat and tinnitus.   Respiratory: Positive for cough.   Neurological: Positive for dizziness.  Psychiatric/Behavioral: The patient is nervous/anxious.        Objective:   Physical Exam        Assessment & Plan:

## 2016-09-30 NOTE — Progress Notes (Signed)
Patient ID: [name], female   DOB: 06/21/1941, 75 y.o.   MRN: [id number] Subjective: Patient presents to the office today for chief complaint of painful callus lesions of the feet. Patient states that the pain is ongoing and is affecting their ability to ambulate without pain. Patient presents today for further treatment and evaluation.  Objective:  Physical Exam General: Alert and oriented x3 in no acute distress  Dermatology: Hyperkeratotic lesion present on the bilateral great toes as well as the sub-second MPJ of the left foot.. Pain on palpation with a central nucleated core noted.  Skin is warm, dry and supple bilateral lower extremities. Negative for open lesions or macerations.  Vascular: Palpable pedal pulses bilaterally. No edema or erythema noted. Capillary refill within normal limits.  Neurological: Epicritic and protective threshold grossly intact bilaterally.   Musculoskeletal Exam: Pain on palpation at the keratotic lesion noted. Range of motion within normal limits bilateral. Muscle strength 5/5 in all groups bilateral.  Assessment: #1 painful callus lesions bilateral great toes #2 porokeratosis sub-second MPJ left foot   Plan of Care:  #1 Patient evaluated #2 Excisional debridement of  keratoic lesion using a chisel blade was performed without incident.  #3 Treated area(s) with Salinocaine and dressed with light dressing. #4 Patient is to return to the clinic PRN.   Edrick Kins, [place]

## 2016-10-03 DIAGNOSIS — M1712 Unilateral primary osteoarthritis, left knee: Secondary | ICD-10-CM | POA: Diagnosis not present

## 2016-10-09 DIAGNOSIS — Z23 Encounter for immunization: Secondary | ICD-10-CM | POA: Diagnosis not present

## 2016-10-11 DIAGNOSIS — M1712 Unilateral primary osteoarthritis, left knee: Secondary | ICD-10-CM | POA: Diagnosis not present

## 2016-11-06 DIAGNOSIS — E7800 Pure hypercholesterolemia, unspecified: Secondary | ICD-10-CM | POA: Diagnosis not present

## 2016-11-06 DIAGNOSIS — R358 Other polyuria: Secondary | ICD-10-CM | POA: Diagnosis not present

## 2016-11-06 DIAGNOSIS — E038 Other specified hypothyroidism: Secondary | ICD-10-CM | POA: Diagnosis not present

## 2016-11-06 DIAGNOSIS — R8299 Other abnormal findings in urine: Secondary | ICD-10-CM | POA: Diagnosis not present

## 2016-11-08 DIAGNOSIS — R1310 Dysphagia, unspecified: Secondary | ICD-10-CM | POA: Diagnosis not present

## 2016-11-08 DIAGNOSIS — H9193 Unspecified hearing loss, bilateral: Secondary | ICD-10-CM | POA: Diagnosis not present

## 2016-11-08 DIAGNOSIS — K635 Polyp of colon: Secondary | ICD-10-CM | POA: Diagnosis not present

## 2016-11-08 DIAGNOSIS — K219 Gastro-esophageal reflux disease without esophagitis: Secondary | ICD-10-CM | POA: Diagnosis not present

## 2016-11-08 DIAGNOSIS — Z8249 Family history of ischemic heart disease and other diseases of the circulatory system: Secondary | ICD-10-CM | POA: Diagnosis not present

## 2016-11-08 DIAGNOSIS — G629 Polyneuropathy, unspecified: Secondary | ICD-10-CM | POA: Diagnosis not present

## 2016-11-08 DIAGNOSIS — E784 Other hyperlipidemia: Secondary | ICD-10-CM | POA: Diagnosis not present

## 2016-11-08 DIAGNOSIS — E038 Other specified hypothyroidism: Secondary | ICD-10-CM | POA: Diagnosis not present

## 2016-11-08 DIAGNOSIS — F39 Unspecified mood [affective] disorder: Secondary | ICD-10-CM | POA: Diagnosis not present

## 2016-11-08 DIAGNOSIS — Z6826 Body mass index (BMI) 26.0-26.9, adult: Secondary | ICD-10-CM | POA: Diagnosis not present

## 2016-11-08 DIAGNOSIS — M25562 Pain in left knee: Secondary | ICD-10-CM | POA: Diagnosis not present

## 2016-11-08 DIAGNOSIS — Z0000 Encounter for general adult medical examination without abnormal findings: Secondary | ICD-10-CM | POA: Diagnosis not present

## 2016-11-13 DIAGNOSIS — Z1212 Encounter for screening for malignant neoplasm of rectum: Secondary | ICD-10-CM | POA: Diagnosis not present

## 2016-11-21 DIAGNOSIS — Z1231 Encounter for screening mammogram for malignant neoplasm of breast: Secondary | ICD-10-CM | POA: Diagnosis not present

## 2016-12-05 DIAGNOSIS — L918 Other hypertrophic disorders of the skin: Secondary | ICD-10-CM | POA: Diagnosis not present

## 2016-12-05 DIAGNOSIS — L820 Inflamed seborrheic keratosis: Secondary | ICD-10-CM | POA: Diagnosis not present

## 2016-12-05 DIAGNOSIS — L570 Actinic keratosis: Secondary | ICD-10-CM | POA: Diagnosis not present

## 2016-12-26 DIAGNOSIS — H6506 Acute serous otitis media, recurrent, bilateral: Secondary | ICD-10-CM | POA: Diagnosis not present

## 2016-12-26 DIAGNOSIS — H906 Mixed conductive and sensorineural hearing loss, bilateral: Secondary | ICD-10-CM | POA: Diagnosis not present

## 2016-12-26 DIAGNOSIS — J069 Acute upper respiratory infection, unspecified: Secondary | ICD-10-CM | POA: Diagnosis not present

## 2016-12-26 DIAGNOSIS — H6061 Unspecified chronic otitis externa, right ear: Secondary | ICD-10-CM | POA: Diagnosis not present

## 2016-12-26 DIAGNOSIS — J209 Acute bronchitis, unspecified: Secondary | ICD-10-CM | POA: Diagnosis not present

## 2017-01-03 ENCOUNTER — Ambulatory Visit: Payer: Medicare Other | Admitting: Podiatry

## 2017-01-17 DIAGNOSIS — H906 Mixed conductive and sensorineural hearing loss, bilateral: Secondary | ICD-10-CM | POA: Diagnosis not present

## 2017-01-17 DIAGNOSIS — H6504 Acute serous otitis media, recurrent, right ear: Secondary | ICD-10-CM | POA: Diagnosis not present

## 2017-02-08 DIAGNOSIS — H906 Mixed conductive and sensorineural hearing loss, bilateral: Secondary | ICD-10-CM | POA: Diagnosis not present

## 2017-02-08 DIAGNOSIS — H6061 Unspecified chronic otitis externa, right ear: Secondary | ICD-10-CM | POA: Diagnosis not present

## 2017-02-08 DIAGNOSIS — H6521 Chronic serous otitis media, right ear: Secondary | ICD-10-CM | POA: Diagnosis not present

## 2017-03-21 ENCOUNTER — Ambulatory Visit (INDEPENDENT_AMBULATORY_CARE_PROVIDER_SITE_OTHER): Payer: Medicare Other

## 2017-03-21 ENCOUNTER — Ambulatory Visit (HOSPITAL_COMMUNITY)
Admission: EM | Admit: 2017-03-21 | Discharge: 2017-03-21 | Disposition: A | Discharge status: Home / Self Care | Payer: Medicare Other | Attending: Internal Medicine | Admitting: Internal Medicine

## 2017-03-21 ENCOUNTER — Encounter (HOSPITAL_COMMUNITY): Payer: Self-pay | Admitting: Emergency Medicine

## 2017-03-21 DIAGNOSIS — M19072 Primary osteoarthritis, left ankle and foot: Secondary | ICD-10-CM | POA: Diagnosis not present

## 2017-03-21 DIAGNOSIS — M7752 Other enthesopathy of left foot: Secondary | ICD-10-CM

## 2017-03-21 DIAGNOSIS — M71572 Other bursitis, not elsewhere classified, left ankle and foot: Secondary | ICD-10-CM

## 2017-03-21 DIAGNOSIS — M79672 Pain in left foot: Secondary | ICD-10-CM

## 2017-03-21 MED ORDER — NAPROXEN 375 MG PO TABS: 375.0000 mg | ORAL_TABLET | Freq: Two times a day (BID) | ORAL | 0 refills | Status: DC

## 2017-03-21 NOTE — ED Triage Notes (Signed)
The patient presented to the [facility] with a complaint of left foot pain that started today. The patient denied any known injury.

## 2017-03-21 NOTE — ED Provider Notes (Signed)
[place]    CSN: [id number] Arrival date & time: 03/21/17  1648     History   Chief Complaint Chief Complaint  Patient presents with  . Foot Pain    HPI [name] is a 76 y.o. female. She presents today with pain in the left foot at the base of the second/third/fourth toes, started this morning. Noticed when she was getting out of the shower that there is some discoloration on the dorsal foot in this area, dark erythema/bruising. Pain is in the plantar aspect of the foot, and occurs with walking and step off. She has had some foot trouble, particularly calluses, but not in this location at this foot. No injury recalled.     HPI  Past Medical History:  Diagnosis Date  . Allergic rhinitis   . Arthritis   . Cataract   . Depression   . GERD (gastroesophageal reflux disease)   . Hemorrhoids   . Hypothyroidism   . Lichen planus   . Migraine headache   . Personal history of colonic adenoma 05/15/2013  . Retinal wrinkling     Patient Active Problem List   Diagnosis Date Noted  . Personal history of colonic adenoma 05/15/2013  . S/P cholecystectomy 05/06/2013  . Family hx of colon cancer 05/06/2013  . Unspecified hypothyroidism 05/06/2013  . GERD (gastroesophageal reflux disease) 05/06/2013    Past Surgical History:  Procedure Laterality Date  . CATARACT EXTRACTION     Dr. Zadie Rhine  . CHOLECYSTECTOMY  04/04/2012   Procedure: LAPAROSCOPIC CHOLECYSTECTOMY WITH INTRAOPERATIVE CHOLANGIOGRAM;  Surgeon: Zenovia Jarred, MD;  Location: [facility];  Service: General;  Laterality: N/A;  . COLONOSCOPY    . Retina Wrinkle with surgery  2006, 2013  . THYROID SURGERY  15 years ago    Home Medications    Prior to Admission medications   Medication Sig Start Date End Date Taking? Authorizing Provider  BIOTIN PO Take 1 tablet by mouth daily.   Yes Historical Provider, MD  levothyroxine (SYNTHROID, LEVOTHROID) 112 MCG tablet Take 56 mcg by mouth daily.    Yes  Historical Provider, MD  Probiotic Product (PROBIOTIC ADVANCED) CAPS Take by mouth daily.   Yes Historical Provider, MD  venlafaxine (EFFEXOR) 75 MG tablet Take 75 mg by mouth daily.   Yes Historical Provider, MD  naproxen (NAPROSYN) 375 MG tablet Take 1 tablet (375 mg total) by mouth 2 (two) times daily. 03/21/17   Sherlene Shams, MD    Family History Family History  Problem Relation Age of Onset  . Coronary artery disease Father   . Diabetes Sister   . Coronary artery disease Mother   . Coronary artery disease Brother   . Colon cancer Brother   . Diabetes Brother     Social History Social History  Substance Use Topics  . Smoking status: Never Smoker  . Smokeless tobacco: Never Used  . Alcohol use No     Allergies   Levofloxacin   Review of Systems Review of Systems  All other systems reviewed and are negative.    Physical Exam Triage Vital Signs ED Triage Vitals  Enc Vitals Group     BP 03/21/17 1712 133/69     Pulse Rate 03/21/17 1712 76     Resp 03/21/17 1712 18     Temp 03/21/17 1712 98.9 F (37.2 C)     Temp Source 03/21/17 1712 Oral     SpO2 03/21/17 1712 100 %     Weight --  Height --      Pain Score 03/21/17 1711 7     Pain Loc --    Updated Vital Signs BP 133/69 (BP Location: Right Arm)   Pulse 76   Temp 98.9 F (37.2 C) (Oral)   Resp 18   SpO2 100%   Physical Exam  Constitutional: She is oriented to person, place, and time. No distress.  Alert, nicely groomed  HENT:  Head: Atraumatic.  Eyes:  Conjugate gaze, no eye redness/drainage  Neck: Neck supple.  Cardiovascular: Normal rate.   Pulmonary/Chest: No respiratory distress.  Abdominal: She exhibits no distension.  Musculoskeletal: Normal range of motion.  No leg swelling Dorsal left foot with a brownish purplish discoloration, nonblanching, at the base of the second/third/fourth toes; plantar left foot with focal swelling and erythema at the proximal second/third/fourth toes.  Minimal callus formation present there.  Neurological: She is alert and oriented to person, place, and time.  Skin: Skin is warm and dry.  No cyanosis  Nursing note and vitals reviewed.    UC Treatments / Results   Procedures Procedures (including critical care time) Xray of foot: radiologist's result pending but no fracture to my exam.    Boot orthosis applied by clinical staff.   Final Clinical Impressions(s) / UC Diagnoses   Final diagnoses:  Foot pain, left  Bursitis of left foot   Wear boot for comfort.  Try ice for pain/inflammation, 10 minutes, several times daily.  Prescription for naproxen (anti inflammatory/pain reliever) sent to [facility].  Followup with podiatrist.  If xray results suggest other treatment is needed, the urgent care will contact you in the interim.   New Prescriptions Discharge Medication List as of 03/21/2017  6:39 PM    START taking these medications   Details  naproxen (NAPROSYN) 375 MG tablet Take 1 tablet (375 mg total) by mouth 2 (two) times daily., Starting Wed 03/21/2017, Normal         Sherlene Shams, MD 03/25/17 2103

## 2017-03-21 NOTE — Discharge Instructions (Addendum)
Wear boot for comfort.  Try ice for pain/inflammation, 10 minutes, several times daily.  Prescription for naproxen (anti inflammatory/pain reliever) sent to [facility].  Followup with podiatrist.  If xray results suggest other treatment is needed, the urgent care will contact you in the interim.

## 2017-03-22 DIAGNOSIS — H903 Sensorineural hearing loss, bilateral: Secondary | ICD-10-CM | POA: Diagnosis not present

## 2017-03-22 DIAGNOSIS — H6981 Other specified disorders of Eustachian tube, right ear: Secondary | ICD-10-CM | POA: Diagnosis not present

## 2017-06-15 DIAGNOSIS — D2261 Melanocytic nevi of right upper limb, including shoulder: Secondary | ICD-10-CM | POA: Diagnosis not present

## 2017-06-15 DIAGNOSIS — D2239 Melanocytic nevi of other parts of face: Secondary | ICD-10-CM | POA: Diagnosis not present

## 2017-06-15 DIAGNOSIS — D225 Melanocytic nevi of trunk: Secondary | ICD-10-CM | POA: Diagnosis not present

## 2017-06-15 DIAGNOSIS — L738 Other specified follicular disorders: Secondary | ICD-10-CM | POA: Diagnosis not present

## 2017-06-15 DIAGNOSIS — L821 Other seborrheic keratosis: Secondary | ICD-10-CM | POA: Diagnosis not present

## 2017-08-06 ENCOUNTER — Encounter: Payer: Self-pay | Admitting: Podiatry

## 2017-08-06 ENCOUNTER — Ambulatory Visit (INDEPENDENT_AMBULATORY_CARE_PROVIDER_SITE_OTHER): Payer: Medicare Other

## 2017-08-06 ENCOUNTER — Ambulatory Visit (INDEPENDENT_AMBULATORY_CARE_PROVIDER_SITE_OTHER): Payer: Medicare Other | Admitting: Podiatry

## 2017-08-06 DIAGNOSIS — S93401A Sprain of unspecified ligament of right ankle, initial encounter: Secondary | ICD-10-CM

## 2017-08-06 DIAGNOSIS — M25571 Pain in right ankle and joints of right foot: Secondary | ICD-10-CM | POA: Diagnosis not present

## 2017-08-06 DIAGNOSIS — IMO0001 Reserved for inherently not codable concepts without codable children: Secondary | ICD-10-CM

## 2017-08-06 DIAGNOSIS — M79671 Pain in right foot: Secondary | ICD-10-CM | POA: Diagnosis not present

## 2017-08-06 DIAGNOSIS — L84 Corns and callosities: Secondary | ICD-10-CM

## 2017-08-06 NOTE — Progress Notes (Signed)
   Subjective: Patient presents to the office today for chief complaint of painful callus lesions of the feet. Patient states that the pain is ongoing and is affecting their ability to ambulate without pain. Patient presents today for further treatment and evaluation. Patient also has a history of ankle sprain approximately 4 months ago. Patient states that the ankle is improved significantly over the past 4 months however she still has some residual pain and tenderness during ambulation.  Objective:  Physical Exam General: Alert and oriented x3 in no acute distress  Dermatology: Hyperkeratotic lesion present on the weightbearing surfaces the bilateral feet 2. Pain on palpation with a central nucleated core noted.  Skin is warm, dry and supple bilateral lower extremities. Negative for open lesions or macerations.  Vascular: Palpable pedal pulses bilaterally. No edema or erythema noted. Capillary refill within normal limits.  Neurological: Epicritic and protective threshold grossly intact bilaterally.   Musculoskeletal Exam: Pain on palpation at the keratotic lesion noted. Range of motion within normal limits bilateral. Muscle strength 5/5 in all groups bilateral.  Assessment: #1 pre-ulcerative callous lesions bilateral great toes 2 #2 grade 1 ankle sprain right   Plan of Care:  #1 Patient evaluated #2 Excisional debridement of keratoic lesion using a chisel blade was performed without incident.  #3 continue conservative management of the ankle sprain including decreased activity and supportive shoe gear #4 return to clinic when necessary  Edrick Kins, DPM [facility]  Dr. Edrick Kins, [place]                                        [place], [place] [address]                Office [phone]  Fax [phone]

## 2017-08-29 DIAGNOSIS — H353111 Nonexudative age-related macular degeneration, right eye, early dry stage: Secondary | ICD-10-CM | POA: Diagnosis not present

## 2017-08-29 DIAGNOSIS — H52203 Unspecified astigmatism, bilateral: Secondary | ICD-10-CM | POA: Diagnosis not present

## 2017-08-29 DIAGNOSIS — H31012 Macula scars of posterior pole (postinflammatory) (post-traumatic), left eye: Secondary | ICD-10-CM | POA: Diagnosis not present

## 2017-08-29 DIAGNOSIS — H04123 Dry eye syndrome of bilateral lacrimal glands: Secondary | ICD-10-CM | POA: Diagnosis not present

## 2017-10-10 DIAGNOSIS — Z23 Encounter for immunization: Secondary | ICD-10-CM | POA: Diagnosis not present

## 2017-11-12 DIAGNOSIS — H6981 Other specified disorders of Eustachian tube, right ear: Secondary | ICD-10-CM | POA: Diagnosis not present

## 2017-11-12 DIAGNOSIS — H903 Sensorineural hearing loss, bilateral: Secondary | ICD-10-CM | POA: Diagnosis not present

## 2017-11-22 DIAGNOSIS — Z803 Family history of malignant neoplasm of breast: Secondary | ICD-10-CM | POA: Diagnosis not present

## 2017-11-22 DIAGNOSIS — Z1231 Encounter for screening mammogram for malignant neoplasm of breast: Secondary | ICD-10-CM | POA: Diagnosis not present

## 2017-11-27 DIAGNOSIS — E7849 Other hyperlipidemia: Secondary | ICD-10-CM | POA: Diagnosis not present

## 2017-11-27 DIAGNOSIS — R82998 Other abnormal findings in urine: Secondary | ICD-10-CM | POA: Diagnosis not present

## 2017-11-27 DIAGNOSIS — E038 Other specified hypothyroidism: Secondary | ICD-10-CM | POA: Diagnosis not present

## 2017-12-04 DIAGNOSIS — G6289 Other specified polyneuropathies: Secondary | ICD-10-CM | POA: Diagnosis not present

## 2017-12-04 DIAGNOSIS — R1319 Other dysphagia: Secondary | ICD-10-CM | POA: Diagnosis not present

## 2017-12-04 DIAGNOSIS — Z1389 Encounter for screening for other disorder: Secondary | ICD-10-CM | POA: Diagnosis not present

## 2017-12-04 DIAGNOSIS — J3089 Other allergic rhinitis: Secondary | ICD-10-CM | POA: Diagnosis not present

## 2017-12-04 DIAGNOSIS — N390 Urinary tract infection, site not specified: Secondary | ICD-10-CM | POA: Diagnosis not present

## 2017-12-04 DIAGNOSIS — Z8249 Family history of ischemic heart disease and other diseases of the circulatory system: Secondary | ICD-10-CM | POA: Diagnosis not present

## 2017-12-04 DIAGNOSIS — Z6825 Body mass index (BMI) 25.0-25.9, adult: Secondary | ICD-10-CM | POA: Diagnosis not present

## 2017-12-04 DIAGNOSIS — Z23 Encounter for immunization: Secondary | ICD-10-CM | POA: Diagnosis not present

## 2017-12-04 DIAGNOSIS — F39 Unspecified mood [affective] disorder: Secondary | ICD-10-CM | POA: Diagnosis not present

## 2017-12-04 DIAGNOSIS — E038 Other specified hypothyroidism: Secondary | ICD-10-CM | POA: Diagnosis not present

## 2017-12-04 DIAGNOSIS — Z0000 Encounter for general adult medical examination without abnormal findings: Secondary | ICD-10-CM | POA: Diagnosis not present

## 2017-12-04 DIAGNOSIS — E7849 Other hyperlipidemia: Secondary | ICD-10-CM | POA: Diagnosis not present

## 2017-12-04 DIAGNOSIS — M25562 Pain in left knee: Secondary | ICD-10-CM | POA: Diagnosis not present

## 2017-12-13 DIAGNOSIS — Z1212 Encounter for screening for malignant neoplasm of rectum: Secondary | ICD-10-CM | POA: Diagnosis not present

## 2018-04-18 ENCOUNTER — Encounter: Payer: Self-pay | Admitting: Internal Medicine

## 2018-05-28 DIAGNOSIS — H52203 Unspecified astigmatism, bilateral: Secondary | ICD-10-CM | POA: Diagnosis not present

## 2018-05-28 DIAGNOSIS — H35373 Puckering of macula, bilateral: Secondary | ICD-10-CM | POA: Diagnosis not present

## 2018-05-28 DIAGNOSIS — Z961 Presence of intraocular lens: Secondary | ICD-10-CM | POA: Diagnosis not present

## 2018-07-23 DIAGNOSIS — H6981 Other specified disorders of Eustachian tube, right ear: Secondary | ICD-10-CM | POA: Diagnosis not present

## 2018-07-23 DIAGNOSIS — H903 Sensorineural hearing loss, bilateral: Secondary | ICD-10-CM | POA: Diagnosis not present

## 2018-07-23 DIAGNOSIS — C44209 Unspecified malignant neoplasm of skin of left ear and external auricular canal: Secondary | ICD-10-CM | POA: Diagnosis not present

## 2018-07-23 DIAGNOSIS — H9202 Otalgia, left ear: Secondary | ICD-10-CM | POA: Diagnosis not present

## 2018-09-22 DIAGNOSIS — R69 Illness, unspecified: Secondary | ICD-10-CM | POA: Diagnosis not present

## 2018-09-23 DIAGNOSIS — H903 Sensorineural hearing loss, bilateral: Secondary | ICD-10-CM | POA: Diagnosis not present

## 2018-11-27 DIAGNOSIS — Z803 Family history of malignant neoplasm of breast: Secondary | ICD-10-CM | POA: Diagnosis not present

## 2018-11-27 DIAGNOSIS — Z1231 Encounter for screening mammogram for malignant neoplasm of breast: Secondary | ICD-10-CM | POA: Diagnosis not present

## 2019-01-17 DIAGNOSIS — L738 Other specified follicular disorders: Secondary | ICD-10-CM | POA: Diagnosis not present

## 2019-01-17 DIAGNOSIS — L821 Other seborrheic keratosis: Secondary | ICD-10-CM | POA: Diagnosis not present

## 2019-01-31 DIAGNOSIS — E038 Other specified hypothyroidism: Secondary | ICD-10-CM | POA: Diagnosis not present

## 2019-01-31 DIAGNOSIS — R82998 Other abnormal findings in urine: Secondary | ICD-10-CM | POA: Diagnosis not present

## 2019-01-31 DIAGNOSIS — E7849 Other hyperlipidemia: Secondary | ICD-10-CM | POA: Diagnosis not present

## 2019-02-05 DIAGNOSIS — E038 Other specified hypothyroidism: Secondary | ICD-10-CM | POA: Diagnosis not present

## 2019-02-05 DIAGNOSIS — E7849 Other hyperlipidemia: Secondary | ICD-10-CM | POA: Diagnosis not present

## 2019-02-05 DIAGNOSIS — K635 Polyp of colon: Secondary | ICD-10-CM | POA: Diagnosis not present

## 2019-02-05 DIAGNOSIS — J3089 Other allergic rhinitis: Secondary | ICD-10-CM | POA: Diagnosis not present

## 2019-02-05 DIAGNOSIS — Z0000 Encounter for general adult medical examination without abnormal findings: Secondary | ICD-10-CM | POA: Diagnosis not present

## 2019-02-05 DIAGNOSIS — N390 Urinary tract infection, site not specified: Secondary | ICD-10-CM | POA: Diagnosis not present

## 2019-02-05 DIAGNOSIS — Z8249 Family history of ischemic heart disease and other diseases of the circulatory system: Secondary | ICD-10-CM | POA: Diagnosis not present

## 2019-02-05 DIAGNOSIS — R1310 Dysphagia, unspecified: Secondary | ICD-10-CM | POA: Diagnosis not present

## 2019-02-05 DIAGNOSIS — R69 Illness, unspecified: Secondary | ICD-10-CM | POA: Diagnosis not present

## 2019-02-05 DIAGNOSIS — G6289 Other specified polyneuropathies: Secondary | ICD-10-CM | POA: Diagnosis not present

## 2019-02-18 DIAGNOSIS — Z1212 Encounter for screening for malignant neoplasm of rectum: Secondary | ICD-10-CM | POA: Diagnosis not present

## 2019-05-13 DIAGNOSIS — H35373 Puckering of macula, bilateral: Secondary | ICD-10-CM | POA: Diagnosis not present

## 2019-05-13 DIAGNOSIS — Z961 Presence of intraocular lens: Secondary | ICD-10-CM | POA: Diagnosis not present

## 2019-05-13 DIAGNOSIS — H52203 Unspecified astigmatism, bilateral: Secondary | ICD-10-CM | POA: Diagnosis not present

## 2019-06-04 DIAGNOSIS — H90A22 Sensorineural hearing loss, unilateral, left ear, with restricted hearing on the contralateral side: Secondary | ICD-10-CM | POA: Diagnosis not present

## 2019-06-04 DIAGNOSIS — T161XXA Foreign body in right ear, initial encounter: Secondary | ICD-10-CM | POA: Diagnosis not present

## 2019-06-04 DIAGNOSIS — H6123 Impacted cerumen, bilateral: Secondary | ICD-10-CM | POA: Diagnosis not present

## 2019-06-04 DIAGNOSIS — H90A31 Mixed conductive and sensorineural hearing loss, unilateral, right ear with restricted hearing on the contralateral side: Secondary | ICD-10-CM | POA: Diagnosis not present

## 2019-06-04 DIAGNOSIS — H903 Sensorineural hearing loss, bilateral: Secondary | ICD-10-CM | POA: Diagnosis not present

## 2019-06-10 DIAGNOSIS — L433 Subacute (active) lichen planus: Secondary | ICD-10-CM | POA: Diagnosis not present

## 2019-06-10 DIAGNOSIS — L700 Acne vulgaris: Secondary | ICD-10-CM | POA: Diagnosis not present

## 2019-07-23 DIAGNOSIS — L433 Subacute (active) lichen planus: Secondary | ICD-10-CM | POA: Diagnosis not present

## 2019-07-23 DIAGNOSIS — L821 Other seborrheic keratosis: Secondary | ICD-10-CM | POA: Diagnosis not present

## 2019-07-23 DIAGNOSIS — L718 Other rosacea: Secondary | ICD-10-CM | POA: Diagnosis not present

## 2019-10-05 DIAGNOSIS — R69 Illness, unspecified: Secondary | ICD-10-CM | POA: Diagnosis not present

## 2019-11-04 ENCOUNTER — Emergency Department (HOSPITAL_COMMUNITY): Payer: Medicare HMO

## 2019-11-04 ENCOUNTER — Emergency Department (HOSPITAL_COMMUNITY)
Admission: EM | Admit: 2019-11-04 | Discharge: 2019-11-04 | Disposition: A | Discharge status: Home / Self Care | Payer: Medicare HMO | Attending: Emergency Medicine | Admitting: Emergency Medicine

## 2019-11-04 ENCOUNTER — Encounter (HOSPITAL_COMMUNITY): Payer: Self-pay

## 2019-11-04 ENCOUNTER — Other Ambulatory Visit: Payer: Self-pay

## 2019-11-04 ENCOUNTER — Ambulatory Visit (INDEPENDENT_AMBULATORY_CARE_PROVIDER_SITE_OTHER)
Admission: EM | Admit: 2019-11-04 | Discharge: 2019-11-04 | Disposition: A | Discharge status: Home / Self Care | Payer: Medicare HMO | Source: Home / Self Care

## 2019-11-04 DIAGNOSIS — R079 Chest pain, unspecified: Secondary | ICD-10-CM | POA: Diagnosis not present

## 2019-11-04 DIAGNOSIS — Z5321 Procedure and treatment not carried out due to patient leaving prior to being seen by health care provider: Secondary | ICD-10-CM | POA: Insufficient documentation

## 2019-11-04 DIAGNOSIS — R0789 Other chest pain: Secondary | ICD-10-CM | POA: Diagnosis present

## 2019-11-04 LAB — BASIC METABOLIC PANEL
Anion gap: 8 (ref 5–15)
BUN: 10 mg/dL (ref 8–23)
CO2: 28 mmol/L (ref 22–32)
Calcium: 9.2 mg/dL (ref 8.9–10.3)
Chloride: 104 mmol/L (ref 98–111)
Creatinine, Ser: 1.04 mg/dL — ABNORMAL HIGH (ref 0.44–1.00)
GFR calc Af Amer: 60 mL/min — ABNORMAL LOW (ref >60)
GFR calc non Af Amer: 51 mL/min — ABNORMAL LOW (ref >60)
Glucose, Bld: 90 mg/dL (ref 70–99)
Potassium: 3.7 mmol/L (ref 3.5–5.1)
Sodium: 140 mmol/L (ref 135–145)

## 2019-11-04 LAB — TROPONIN I (HIGH SENSITIVITY): Troponin I (High Sensitivity): 3 ng/L (ref <18)

## 2019-11-04 LAB — CBC
HCT: 41 % (ref 36.0–46.0)
Hemoglobin: 13.4 g/dL (ref 12.0–15.0)
MCH: 31.7 pg (ref 26.0–34.0)
MCHC: 32.7 g/dL (ref 30.0–36.0)
MCV: 96.9 fL (ref 80.0–100.0)
Platelets: 220 10*3/uL (ref 150–400)
RBC: 4.23 MIL/uL (ref 3.87–5.11)
RDW: 12.2 % (ref 11.5–15.5)
WBC: 7 10*3/uL (ref 4.0–10.5)
nRBC: 0 % (ref 0.0–0.2)

## 2019-11-04 MED ORDER — SODIUM CHLORIDE 0.9% FLUSH: 3.0000 mL | Freq: Once | INTRAVENOUS | Status: DC

## 2019-11-04 MED ORDER — ASPIRIN 81 MG PO CHEW
324.0000 mg | CHEWABLE_TABLET | Freq: Once | ORAL | Status: AC
  Administered 2019-11-04: 324 mg via ORAL

## 2019-11-04 NOTE — ED Notes (Signed)
Pt states they are leaving. Do not want to wait any longer. RN advised against leaving. Pt does not care and is leaving.

## 2019-11-04 NOTE — ED Triage Notes (Signed)
Pt presents w/mid-sternum chest pain radiating to her Left shoulder/back started yesterday. Pt woke this am w/omgoing pain. Reports ASA relieved her pain

## 2019-11-04 NOTE — Discharge Instructions (Signed)
78 year old female comes in for 2-day history of chest pressure that is now radiating to the back.  This started yesterday while she was being active, and was intermittent without known aggravating or alleviating factor.  Since this morning, pressure has been constant, worse with deep breathing.  She has felt slightly short of breath, where she feels she needs to work harder to take deep breaths.  Felt some nausea this morning without vomiting.  Nausea has since resolved.  Denies diaphoresis, weakness, dizziness, syncope.  Denies URI symptoms, fever, abdominal pain.  Patient was stable vitals, BP 130/79, temp 98.3 Fahrenheit, pulse rate 79, respiratory 16, O2 sat 94% EKG normal sinus rhythm, low voltage, no significant changes from prior EKG, no ST elevation. Patient was given aspirin 324 mg with some relief.  Given age, chest pressure, discharged to the emergency department for further evaluation needed.

## 2019-11-04 NOTE — ED Triage Notes (Signed)
Pt c/o lt sided chest pain/pressure while active yesterday, now still having pain radiating to lt shoulder

## 2019-11-04 NOTE — ED Notes (Signed)
Pt d/c'd to go to ED for further evaluation. Pt is going POV by husband

## 2019-11-04 NOTE — ED Provider Notes (Signed)
78 year old female comes in for 2-day history of chest pressure.  This started yesterday, was intermittent without aggravating or alleviating factor.  States she was out running errands when this first occurred.  She woke up this morning with increased chest pressure, that is now constant, worse with deep breathing.  No obvious alleviating factor.  Denies fever, chills, body aches.  Denies URI symptoms such as cough, congestion, sore throat.  Had episode of nausea without vomiting this morning, this has since resolved.  Denies diaphoresis, weakness, dizziness, syncope.  Has noticed exertional fatigue for the past month, no exertional chest pain or dyspnea on exertion.  Tried Tums this morning without relief.  Stable vitals.  Regular rate and rhythm, lungs clear to auscultation bilaterally without adventitious lung sounds. EKG normal sinus rhythm, low voltage, no obvious changes from prior EKG, no ST elevation. 324 mg aspirin was given, with mild relief.  Discussed with patient, concerning story, and will need further evaluation at the emergency department.  Offered EMS transport, for which patient declined.  Risks discussed, patient expresses understanding.  Patient discharged in stable condition, spouse will transport patient to the ED for further evaluation.   Ok Edwards, PA-C 11/04/19 1352

## 2019-11-05 ENCOUNTER — Other Ambulatory Visit: Payer: Self-pay

## 2019-11-05 ENCOUNTER — Encounter (HOSPITAL_COMMUNITY): Payer: Self-pay | Admitting: Emergency Medicine

## 2019-11-05 ENCOUNTER — Emergency Department (HOSPITAL_COMMUNITY)
Admission: EM | Admit: 2019-11-05 | Discharge: 2019-11-05 | Disposition: A | Discharge status: Home / Self Care | Payer: Medicare HMO | Attending: Emergency Medicine | Admitting: Emergency Medicine

## 2019-11-05 DIAGNOSIS — Z79899 Other long term (current) drug therapy: Secondary | ICD-10-CM | POA: Diagnosis not present

## 2019-11-05 DIAGNOSIS — E039 Hypothyroidism, unspecified: Secondary | ICD-10-CM | POA: Diagnosis not present

## 2019-11-05 DIAGNOSIS — R0789 Other chest pain: Secondary | ICD-10-CM | POA: Insufficient documentation

## 2019-11-05 LAB — CBC
HCT: 42.2 % (ref 36.0–46.0)
Hemoglobin: 13.6 g/dL (ref 12.0–15.0)
MCH: 31.3 pg (ref 26.0–34.0)
MCHC: 32.2 g/dL (ref 30.0–36.0)
MCV: 97.2 fL (ref 80.0–100.0)
Platelets: 233 10*3/uL (ref 150–400)
RBC: 4.34 MIL/uL (ref 3.87–5.11)
RDW: 12.2 % (ref 11.5–15.5)
WBC: 5.1 10*3/uL (ref 4.0–10.5)
nRBC: 0 % (ref 0.0–0.2)

## 2019-11-05 LAB — BASIC METABOLIC PANEL
Anion gap: 9 (ref 5–15)
BUN: 10 mg/dL (ref 8–23)
CO2: 27 mmol/L (ref 22–32)
Calcium: 9.3 mg/dL (ref 8.9–10.3)
Chloride: 105 mmol/L (ref 98–111)
Creatinine, Ser: 0.92 mg/dL (ref 0.44–1.00)
GFR calc Af Amer: >60 mL/min (ref >60)
GFR calc non Af Amer: 60 mL/min — ABNORMAL LOW (ref >60)
Glucose, Bld: 102 mg/dL — ABNORMAL HIGH (ref 70–99)
Potassium: 3.9 mmol/L (ref 3.5–5.1)
Sodium: 141 mmol/L (ref 135–145)

## 2019-11-05 LAB — TROPONIN I (HIGH SENSITIVITY): Troponin I (High Sensitivity): 4 ng/L (ref <18)

## 2019-11-05 MED ORDER — SODIUM CHLORIDE 0.9% FLUSH: 3.0000 mL | Freq: Once | INTRAVENOUS | Status: DC

## 2019-11-05 NOTE — ED Provider Notes (Signed)
[facility] EMERGENCY DEPARTMENT Provider Note   CSN: [id number] Arrival date & time: 11/05/19  1316     History   Chief Complaint Chief Complaint  Patient presents with   Chest Pain    HPI [name] is a 78 y.o. female with a past medical history of GERD presenting to the ED with a chief complaint of chest pressure.  2 days ago started having sharp pain in her chest while outside in the store.  She notes history of similar symptoms in the past but unsure why states that "I know they checked out my heart and said everything was okay."  She resented to the ED yesterday but left without being seen.  She then called her PCP who told her she needed to be evaluated in the ED.  States that since taking aspirin yesterday, her chest pain has greatly improved and is now just a dull pressure sensation.  Denies any shortness of breath, cough, hemoptysis or leg swelling.  She denies history of MI, DVT or PE in the past.  Denies any alcohol, tobacco or other drug use.  Has not been evaluated by cardiologist in the past.     HPI  Past Medical History:  Diagnosis Date   Allergic rhinitis    Arthritis    Cataract    Depression    GERD (gastroesophageal reflux disease)    Hemorrhoids    Hypothyroidism    Lichen planus    Migraine headache    Personal history of colonic adenoma 05/15/2013   Retinal wrinkling     Patient Active Problem List   Diagnosis Date Noted   Personal history of colonic adenoma 05/15/2013   S/P cholecystectomy 05/06/2013   Family hx of colon cancer 05/06/2013   Unspecified hypothyroidism 05/06/2013   GERD (gastroesophageal reflux disease) 05/06/2013    Past Surgical History:  Procedure Laterality Date   CATARACT EXTRACTION     Dr. Zadie Rhine   CHOLECYSTECTOMY  04/04/2012   Procedure: LAPAROSCOPIC CHOLECYSTECTOMY WITH INTRAOPERATIVE CHOLANGIOGRAM;  Surgeon: Zenovia Jarred, MD;  Location: [facility];  Service: General;   Laterality: N/A;   COLONOSCOPY     Retina Wrinkle with surgery  2006, 2013   THYROID SURGERY  15 years ago     OB History   No obstetric history on file.      Home Medications    Prior to Admission medications   Medication Sig Start Date End Date Taking? Authorizing Provider  BIOTIN PO Take 1 tablet by mouth daily.    [provider]  levothyroxine (SYNTHROID, LEVOTHROID) 112 MCG tablet Take 56 mcg by mouth daily.     [provider]  naproxen (NAPROSYN) 375 MG tablet Take 1 tablet (375 mg total) by mouth 2 (two) times daily. 03/21/17   Wynona Luna, MD  Probiotic Product (PROBIOTIC ADVANCED) CAPS Take by mouth daily.    [provider]  venlafaxine (EFFEXOR) 75 MG tablet Take 75 mg by mouth daily.    [provider]    Family History Family History  Problem Relation Age of Onset   Coronary artery disease Father    Diabetes Sister    Coronary artery disease Mother    Coronary artery disease Brother    Colon cancer Brother    Diabetes Brother     Social History Social History   Tobacco Use   Smoking status: Never Smoker   Smokeless tobacco: Never Used  Substance Use Topics   Alcohol use:  No   Drug use: No     Allergies   Levofloxacin   Review of Systems Review of Systems  Constitutional: Negative for appetite change, chills and fever.  HENT: Negative for ear pain, rhinorrhea, sneezing and sore throat.   Eyes: Negative for photophobia and visual disturbance.  Respiratory: Negative for cough, chest tightness, shortness of breath and wheezing.   Cardiovascular: Positive for chest pain. Negative for palpitations.  Gastrointestinal: Negative for abdominal pain, blood in stool, constipation, diarrhea, nausea and vomiting.  Genitourinary: Negative for dysuria, hematuria and urgency.  Musculoskeletal: Negative for myalgias.  Skin: Negative for rash.  Neurological: Negative for dizziness, weakness and  light-headedness.     Physical Exam Updated Vital Signs BP (!) 144/75    Pulse 78    Temp 98 F (36.7 C) (Oral)    Resp 16    Ht 5\' 4"  (1.626 m)    Wt 68 kg    SpO2 97%    BMI 25.75 kg/m   Physical Exam Vitals signs and nursing note reviewed.  Constitutional:      General: She is not in acute distress.    Appearance: She is well-developed.  HENT:     Head: Normocephalic and atraumatic.     Nose: Nose normal.  Eyes:     General: No scleral icterus.       Left eye: No discharge.     Conjunctiva/sclera: Conjunctivae normal.  Neck:     Musculoskeletal: Normal range of motion and neck supple.  Cardiovascular:     Rate and Rhythm: Normal rate and regular rhythm.     Heart sounds: Normal heart sounds. No murmur. No friction rub. No gallop.   Pulmonary:     Effort: Pulmonary effort is normal. No respiratory distress.     Breath sounds: Normal breath sounds.  Abdominal:     General: Bowel sounds are normal. There is no distension.     Palpations: Abdomen is soft.     Tenderness: There is no abdominal tenderness. There is no guarding.  Musculoskeletal: Normal range of motion.     Comments: No lower extremity edema, erythema or calf tenderness bilaterally.  Skin:    General: Skin is warm and dry.     Findings: No rash.  Neurological:     Mental Status: She is alert.     Motor: No abnormal muscle tone.     Coordination: Coordination normal.      ED Treatments / Results  Labs (all labs ordered are listed, but only abnormal results are displayed) Labs Reviewed  BASIC METABOLIC PANEL - Abnormal; Notable for the following components:      Result Value   Glucose, Bld 102 (*)    GFR calc non Af Amer 60 (*)    All other components within normal limits  CBC  TROPONIN I (HIGH SENSITIVITY)    EKG None  Radiology Dg Chest 2 View  Result Date: 11/04/2019 CLINICAL DATA:  78 year old female with chest pain. EXAM: CHEST - 2 VIEW COMPARISON:  Chest radiograph dated 04/05/2015  FINDINGS: The heart size and mediastinal contours are within normal limits. Both lungs are clear. The visualized skeletal structures are unremarkable. IMPRESSION: No active cardiopulmonary disease. Electronically Signed   By: Anner Crete M.D.   On: 11/04/2019 14:44    Procedures Procedures (including critical care time)  Medications Ordered in ED Medications  sodium chloride flush (NS) 0.9 % injection 3 mL (has no administration in time range)     Initial  Impression / Assessment and Plan / ED Course  I have reviewed the triage vital signs and the nursing notes.  Pertinent labs & imaging results that were available during my care of the patient were reviewed by me and considered in my medical decision making (see chart for details).        78 year old female with past medical history of GERD presenting to the ED with a chief complaint of chest pressure.  2 days ago started having sharp pain in her chest while doing chores.  She states the pain has gradually improved since yesterday.  She presented to the ED yesterday but left without being seen due to long wait times.  Patient presented again today at the advice of her PCP.  Reports a dull pain in her chest now which has improved.  Denies any cough, shortness of breath, leg swelling, history of MI, DVT or PE in the past.  On exam patient is overall well-appearing.  Her vital signs are within normal limits, she is not tachycardic, tachypneic or hypoxic.  No concern for DVT on exam.  She is low risk by Wells criteria.  Lab work including yesterday and today with what is essentially a 24hr delta troponin as well as CBC, BMP and chest x-ray are all unremarkable.  EKG with no ischemic changes, no changes from prior tracings.  She is low risk by heart score of 3.  Feel that her chest pain is low suspicion for cardiac or pulmonary cause.  Patient is agreeable for discharge home and follow-up with PCP.  Patient is hemodynamically stable, in NAD,  and able to ambulate in the ED. Evaluation does not show pathology that would require ongoing emergent intervention or inpatient treatment. I explained the diagnosis to the patient. Pain has been managed and has no complaints prior to discharge. Patient is comfortable with above plan and is stable for discharge at this time. All questions were answered prior to disposition. Strict return precautions for returning to the ED were discussed. Encouraged follow up with PCP.   An After Visit Summary was printed and given to the patient.   Portions of this note were generated with [employer]. Dictation errors may occur despite best attempts at proofreading.   Final Clinical Impressions(s) / ED Diagnoses   Final diagnoses:  Chest wall pain    ED Discharge Orders    None       Delia Heady, PA-C 11/05/19 2300    Charlesetta Shanks, MD 11/07/19 1312

## 2019-11-05 NOTE — Discharge Instructions (Signed)
Continue your home medications as previously prescribed. Return to the ED if you start to have worsening symptoms, increasing chest pain, leg swelling, shortness of breath

## 2019-11-05 NOTE — ED Triage Notes (Signed)
Pt here yesterday for same. Pt reports central cp that radiates to upper back that started Monday. Pt reports it is better today than yesterday. Not so much back pain anymore. Pain 2/10.

## 2019-11-05 NOTE — ED Notes (Signed)
Patient verbalizes understanding of discharge instructions. Opportunity for questioning and answers were provided. Armband removed by staff, pt discharged from ED.  

## 2019-11-07 DIAGNOSIS — K219 Gastro-esophageal reflux disease without esophagitis: Secondary | ICD-10-CM | POA: Diagnosis not present

## 2019-11-07 DIAGNOSIS — R0789 Other chest pain: Secondary | ICD-10-CM | POA: Diagnosis not present

## 2019-11-07 DIAGNOSIS — E785 Hyperlipidemia, unspecified: Secondary | ICD-10-CM | POA: Diagnosis not present

## 2019-11-07 DIAGNOSIS — Z8249 Family history of ischemic heart disease and other diseases of the circulatory system: Secondary | ICD-10-CM | POA: Diagnosis not present

## 2019-11-17 DIAGNOSIS — M2042 Other hammer toe(s) (acquired), left foot: Secondary | ICD-10-CM | POA: Diagnosis not present

## 2019-11-17 DIAGNOSIS — M2041 Other hammer toe(s) (acquired), right foot: Secondary | ICD-10-CM | POA: Diagnosis not present

## 2019-12-01 ENCOUNTER — Other Ambulatory Visit: Payer: Self-pay

## 2019-12-01 ENCOUNTER — Ambulatory Visit (INDEPENDENT_AMBULATORY_CARE_PROVIDER_SITE_OTHER): Payer: Medicare HMO | Admitting: Podiatry

## 2019-12-01 ENCOUNTER — Ambulatory Visit: Payer: Medicare Other

## 2019-12-01 ENCOUNTER — Encounter: Payer: Self-pay | Admitting: Podiatry

## 2019-12-01 DIAGNOSIS — M2042 Other hammer toe(s) (acquired), left foot: Secondary | ICD-10-CM | POA: Diagnosis not present

## 2019-12-01 DIAGNOSIS — M2041 Other hammer toe(s) (acquired), right foot: Secondary | ICD-10-CM | POA: Diagnosis not present

## 2019-12-01 DIAGNOSIS — L989 Disorder of the skin and subcutaneous tissue, unspecified: Secondary | ICD-10-CM | POA: Diagnosis not present

## 2019-12-03 DIAGNOSIS — Z1231 Encounter for screening mammogram for malignant neoplasm of breast: Secondary | ICD-10-CM | POA: Diagnosis not present

## 2019-12-03 DIAGNOSIS — Z803 Family history of malignant neoplasm of breast: Secondary | ICD-10-CM | POA: Diagnosis not present

## 2019-12-03 NOTE — Progress Notes (Signed)
   Subjective: 78 year old female presenting today with a chief complaint of recurring painful callus lesions of the bilateral great toes. She states the pain has worsened in the past several weeks. She has not had any recent treatment. Bearing weight and walking increases the pain. Patient is here for further evaluation and treatment.   Past Medical History:  Diagnosis Date  . Allergic rhinitis   . Arthritis   . Cataract   . Depression   . GERD (gastroesophageal reflux disease)   . Hemorrhoids   . Hypothyroidism   . Lichen planus   . Migraine headache   . Personal history of colonic adenoma 05/15/2013  . Retinal wrinkling     Objective:  Physical Exam General: Alert and oriented x3 in no acute distress  Dermatology: Hyperkeratotic lesion present on the weightbearing surfaces the bilateral feet 2. Pain on palpation with a central nucleated core noted.  Skin is warm, dry and supple bilateral lower extremities. Negative for open lesions or macerations.  Vascular: Palpable pedal pulses bilaterally. No edema or erythema noted. Capillary refill within normal limits.  Neurological: Epicritic and protective threshold grossly intact bilaterally.   Musculoskeletal Exam: Pain on palpation at the keratotic lesion noted. Hammertoe contracture deformity noted to the 2nd digit of the bilateral feet. Range of motion within normal limits bilateral. Muscle strength 5/5 in all groups bilateral.  Assessment: #1 pre-ulcerative callous lesions bilateral great toes  2 #2 hammertoe contracture 2nd digit bilateral    Plan of Care:  #1 Patient evaluated #2 Excisional debridement of keratoic lesion using a chisel blade was performed without incident.  #3 Silicone toe caps dispensed.  #4 Urea 40% lotion provided to patient.  #5 Return to clinic as needed.    Edrick Kins, DPM [facility]  Dr. Edrick Kins, [address]                                         [place], [place] [address]                Office [phone]  Fax [phone]

## 2020-01-13 ENCOUNTER — Ambulatory Visit: Payer: Medicare HMO | Attending: Internal Medicine

## 2020-01-13 DIAGNOSIS — Z23 Encounter for immunization: Secondary | ICD-10-CM | POA: Insufficient documentation

## 2020-01-13 NOTE — Progress Notes (Signed)
   Covid-19 Vaccination Clinic  Name:  [name]    MRN: [id number] DOB: June 08, 1941  01/13/2020  [name] was observed post Covid-19 immunization for 30 minutes based on pre-vaccination screening without incidence. She was provided with Vaccine Information Sheet and instruction to access the V-Safe system.   [name] was instructed to call 911 with any severe reactions post vaccine: Marland Kitchen Difficulty breathing  . Swelling of your face and throat  . A fast heartbeat  . A bad rash all over your body  . Dizziness and weakness    Immunizations Administered    Name Date Dose VIS Date Route   Pfizer COVID-19 Vaccine 01/13/2020  9:59 AM 0.3 mL 12/05/2019 Intramuscular   Manufacturer: [employer]   Lot: [id number]   [place]: [id number]

## 2020-02-03 ENCOUNTER — Ambulatory Visit: Payer: Medicare HMO | Attending: Internal Medicine

## 2020-02-03 DIAGNOSIS — Z23 Encounter for immunization: Secondary | ICD-10-CM | POA: Insufficient documentation

## 2020-02-03 NOTE — Progress Notes (Signed)
   Covid-19 Vaccination Clinic  Name:  [name]    MRN: [id number] DOB: 1941/01/11  02/03/2020  [name] was observed post Covid-19 immunization for 30 minutes based on pre-vaccination screening without incidence. She was provided with Vaccine Information Sheet and instruction to access the V-Safe system.   [name] was instructed to call 911 with any severe reactions post vaccine: Marland Kitchen Difficulty breathing  . Swelling of your face and throat  . A fast heartbeat  . A bad rash all over your body  . Dizziness and weakness    Immunizations Administered    Name Date Dose VIS Date Route   Pfizer COVID-19 Vaccine 02/03/2020  9:44 AM 0.3 mL 12/05/2019 Intramuscular   Manufacturer: [place]   Lot: [id number]   [place]: [id number]

## 2020-02-10 DIAGNOSIS — L821 Other seborrheic keratosis: Secondary | ICD-10-CM | POA: Diagnosis not present

## 2020-02-10 DIAGNOSIS — D1801 Hemangioma of skin and subcutaneous tissue: Secondary | ICD-10-CM | POA: Diagnosis not present

## 2020-02-10 DIAGNOSIS — D225 Melanocytic nevi of trunk: Secondary | ICD-10-CM | POA: Diagnosis not present

## 2020-02-10 DIAGNOSIS — L738 Other specified follicular disorders: Secondary | ICD-10-CM | POA: Diagnosis not present

## 2020-02-10 DIAGNOSIS — L700 Acne vulgaris: Secondary | ICD-10-CM | POA: Diagnosis not present

## 2020-03-09 DIAGNOSIS — Z0000 Encounter for general adult medical examination without abnormal findings: Secondary | ICD-10-CM | POA: Diagnosis not present

## 2020-03-09 DIAGNOSIS — E7849 Other hyperlipidemia: Secondary | ICD-10-CM | POA: Diagnosis not present

## 2020-03-09 DIAGNOSIS — E039 Hypothyroidism, unspecified: Secondary | ICD-10-CM | POA: Diagnosis not present

## 2020-03-16 ENCOUNTER — Encounter: Payer: Self-pay | Admitting: Neurology

## 2020-03-16 DIAGNOSIS — R82998 Other abnormal findings in urine: Secondary | ICD-10-CM | POA: Diagnosis not present

## 2020-03-16 DIAGNOSIS — E785 Hyperlipidemia, unspecified: Secondary | ICD-10-CM | POA: Diagnosis not present

## 2020-03-16 DIAGNOSIS — R0789 Other chest pain: Secondary | ICD-10-CM | POA: Diagnosis not present

## 2020-03-16 DIAGNOSIS — F39 Unspecified mood [affective] disorder: Secondary | ICD-10-CM | POA: Diagnosis not present

## 2020-03-16 DIAGNOSIS — E039 Hypothyroidism, unspecified: Secondary | ICD-10-CM | POA: Diagnosis not present

## 2020-03-16 DIAGNOSIS — K635 Polyp of colon: Secondary | ICD-10-CM | POA: Diagnosis not present

## 2020-03-16 DIAGNOSIS — J309 Allergic rhinitis, unspecified: Secondary | ICD-10-CM | POA: Diagnosis not present

## 2020-03-16 DIAGNOSIS — R42 Dizziness and giddiness: Secondary | ICD-10-CM | POA: Diagnosis not present

## 2020-03-16 DIAGNOSIS — Z0000 Encounter for general adult medical examination without abnormal findings: Secondary | ICD-10-CM | POA: Diagnosis not present

## 2020-03-16 DIAGNOSIS — Z8249 Family history of ischemic heart disease and other diseases of the circulatory system: Secondary | ICD-10-CM | POA: Diagnosis not present

## 2020-03-16 DIAGNOSIS — Z1331 Encounter for screening for depression: Secondary | ICD-10-CM | POA: Diagnosis not present

## 2020-04-09 DIAGNOSIS — H35373 Puckering of macula, bilateral: Secondary | ICD-10-CM | POA: Diagnosis not present

## 2020-04-09 DIAGNOSIS — H52203 Unspecified astigmatism, bilateral: Secondary | ICD-10-CM | POA: Diagnosis not present

## 2020-04-26 NOTE — Progress Notes (Signed)
NEUROLOGY CONSULTATION NOTE  [place] Dun[place] MRN: [id number] DOB: 1941-03-03  Referring provider: Prince Solian, MD Primary care provider: Prince Solian, MD  Reason for consult:  neuropathy  HISTORY OF PRESENT ILLNESS: [name] is a 79 year old right-handed white female with hypothyroidism and arthritis who presents for peripheral neuropathy.  History supplemented by referring provider's note.  She reports pain in both feet for at least a year, which has progressed.  They burn whenever she walks.  Sometimes they tingle.   She sometimes has non-radiating back pain.  She has cramps in the legs but no radicular pain.  She also reports cramps in her fingers as well.     She reports that her mother used to complain of pain in the feet.  She does not have diabetes.  Her thyroid is controlled.  03/08/2020 LABS:  CMP with Na 142, K 4.3, Cl 106, Co2 25, glucose 92, BUN 11, Cr 0.9, t bili 0.6, ALP 88, AST 24, ALT 11; CBC with WBC 4.99, HGB 14.1, HCT 44.7, PLT 218; vitamin D 10.6; TSH 1.73, free T4 90  PAST MEDICAL HISTORY: Past Medical History:  Diagnosis Date  . Allergic rhinitis   . Arthritis   . Cataract   . Depression   . GERD (gastroesophageal reflux disease)   . Hemorrhoids   . Hypothyroidism   . Lichen planus   . Migraine headache   . Personal history of colonic adenoma 05/15/2013  . Retinal wrinkling     PAST SURGICAL HISTORY: Past Surgical History:  Procedure Laterality Date  . CATARACT EXTRACTION     Dr. Zadie Rhine  . CHOLECYSTECTOMY  04/04/2012   Procedure: LAPAROSCOPIC CHOLECYSTECTOMY WITH INTRAOPERATIVE CHOLANGIOGRAM;  Surgeon: Zenovia Jarred, MD;  Location: [facility];  Service: General;  Laterality: N/A;  . COLONOSCOPY    . Retina Wrinkle with surgery  2006, 2013  . THYROID SURGERY  15 years ago    MEDICATIONS: Current Outpatient Medications on File Prior to Visit  Medication Sig Dispense Refill  . azithromycin (ZITHROMAX) 500 MG tablet azithromycin 500  mg tablet    . BIOTIN PO Take 1 tablet by mouth daily.    . cephALEXin (KEFLEX) 500 MG capsule cephalexin 500 mg capsule    . fluticasone (FLONASE) 50 MCG/ACT nasal spray fluticasone propionate 50 mcg/actuation nasal spray,suspension    . HYDROcodone-acetaminophen (NORCO/VICODIN) 5-325 MG tablet hydrocodone 5 mg-acetaminophen 325 mg tablet    . hydrOXYzine (ATARAX/VISTARIL) 25 MG tablet hydroxyzine HCl 25 mg tablet    . levothyroxine (SYNTHROID, LEVOTHROID) 112 MCG tablet Take 56 mcg by mouth daily.     Marland Kitchen loratadine (CLARITIN) 10 MG tablet Take 10 mg by mouth daily.    Marland Kitchen METRONIDAZOLE, TOPICAL, 0.75 % LOTN Apply 1 application topically 2 (two) times daily.    . mometasone (ELOCON) 0.1 % lotion mometasone 0.1 % topical solution    . naproxen (NAPROSYN) 375 MG tablet Take 1 tablet (375 mg total) by mouth 2 (two) times daily. 20 tablet 0  . nitrofurantoin (MACRODANTIN) 100 MG capsule nitrofurantoin macrocrystal 100 mg capsule    . Probiotic Product (PROBIOTIC ADVANCED) CAPS Take by mouth daily.    Marland Kitchen venlafaxine (EFFEXOR) 75 MG tablet Take 75 mg by mouth daily.     No current facility-administered medications on file prior to visit.    ALLERGIES: Allergies  Allergen Reactions  . Levofloxacin Nausea And Vomiting    FAMILY HISTORY: Family History  Problem Relation Age of Onset  . Coronary artery  disease Father   . Diabetes Sister   . Coronary artery disease Mother   . Coronary artery disease Brother   . Colon cancer Brother   . Diabetes Brother    SOCIAL HISTORY: Social History   Socioeconomic History  . Marital status: Married    Spouse name: Not on file  . Number of children: Not on file  . Years of education: Not on file  . Highest education level: Not on file  Occupational History  . Occupation: retired  Tobacco Use  . Smoking status: Never Smoker  . Smokeless tobacco: Never Used  Substance and Sexual Activity  . Alcohol use: No  . Drug use: No  . Sexual activity:  Never  Other Topics Concern  . 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 Difficulty of Paying Living Expenses:   Food Insecurity:   . Worried About Charity fundraiser in the Last Year:   . Arboriculturist in the Last Year:   Transportation Needs:   . Film/video editor (Medical):   Marland Kitchen Lack of Transportation (Non-Medical):   Physical Activity:   . Days of Exercise per Week:   . Minutes of Exercise per Session:   Stress:   . Feeling of Stress :   Social Connections:   . Frequency of Communication with Friends and Family:   . Frequency of Social Gatherings with Friends and Family:   . Attends Religious Services:   . Active Member of Clubs or Organizations:   . Attends Archivist Meetings:   Marland Kitchen Marital Status:   Intimate Partner Violence:   . Fear of Current or Ex-Partner:   . Emotionally Abused:   Marland Kitchen Physically Abused:   . Sexually Abused:     PHYSICAL EXAM: Blood pressure (!) 155/78, pulse 80, height 5\' 4"  (1.626 m), weight 151 lb 14.4 oz (68.9 kg), SpO2 96 %. General: No acute distress.  Patient appears well-groomed.  Head:  Normocephalic/atraumatic Eyes:  fundi examined but not visualized Neck: supple, no paraspinal tenderness, full range of motion Back: No paraspinal tenderness Heart: regular rate and rhythm Lungs: Clear to auscultation bilaterally. Vascular: No carotid bruits. Neurological Exam: Mental status: alert and oriented to person, place, and time, recent and remote memory intact, fund of knowledge intact, attention and concentration intact, speech fluent and not dysarthric, language intact. Cranial nerves: CN I: not tested CN II: pupils equal, round and reactive to light, visual fields intact CN III, IV, VI:  full range of motion, no nystagmus, no ptosis CN V: facial sensation intact CN VII:  upper and lower face symmetric CN VIII: hearing intact CN IX, X: gag intact, uvula midline CN XI: sternocleidomastoid and trapezius muscles intact CN XII: tongue midline Bulk & Tone: normal, no fasciculations. Motor:  5/5 throughout  Sensation:  Pinprick sensation reduced in fingers and palms and toes up to the ankles.  Vibratory sensation reduced in toes.. Deep Tendon Reflexes:  2+ throughout except absent in ankles; toes downgoing.  Finger to nose testing:  Without dysmetria.  Heel to shin:  Without dysmetria.  Gait:  Mildly wide-based.  Able to turn, difficult with tandem walk. Romberg negative.  IMPRESSION: Peripheral neuropathy.  PLAN: 1.  For pain, start gabapentin 100mg  three times daily.  We can increase dose as needed/tolerated. 2.  Check ANA, sed rate, SPEP/IFE, B6.  B12 was performed by her PCP and was reportedly okay.  Will request that result. 3.  NCV-EMG 4.  Follow up in 4 months.  Thank you for allowing me to take part in the care of this patient.  Metta Clines, DO  CC: Prince Solian, MD

## 2020-04-27 ENCOUNTER — Encounter: Payer: Self-pay | Admitting: Neurology

## 2020-04-27 ENCOUNTER — Ambulatory Visit: Payer: Medicare HMO | Admitting: Neurology

## 2020-04-27 ENCOUNTER — Other Ambulatory Visit: Payer: Self-pay

## 2020-04-27 VITALS — BP 155/78 | HR 80 | Ht 64.0 in | Wt 151.9 lb

## 2020-04-27 DIAGNOSIS — M859 Disorder of bone density and structure, unspecified: Secondary | ICD-10-CM | POA: Diagnosis not present

## 2020-04-27 DIAGNOSIS — G609 Hereditary and idiopathic neuropathy, unspecified: Secondary | ICD-10-CM | POA: Diagnosis not present

## 2020-04-27 DIAGNOSIS — F39 Unspecified mood [affective] disorder: Secondary | ICD-10-CM | POA: Diagnosis not present

## 2020-04-27 DIAGNOSIS — R0789 Other chest pain: Secondary | ICD-10-CM | POA: Diagnosis not present

## 2020-04-27 DIAGNOSIS — G629 Polyneuropathy, unspecified: Secondary | ICD-10-CM | POA: Diagnosis not present

## 2020-04-27 DIAGNOSIS — R42 Dizziness and giddiness: Secondary | ICD-10-CM | POA: Diagnosis not present

## 2020-04-27 DIAGNOSIS — M25562 Pain in left knee: Secondary | ICD-10-CM | POA: Diagnosis not present

## 2020-04-27 DIAGNOSIS — R030 Elevated blood-pressure reading, without diagnosis of hypertension: Secondary | ICD-10-CM | POA: Diagnosis not present

## 2020-04-27 DIAGNOSIS — E038 Other specified hypothyroidism: Secondary | ICD-10-CM | POA: Diagnosis not present

## 2020-04-27 DIAGNOSIS — M8589 Other specified disorders of bone density and structure, multiple sites: Secondary | ICD-10-CM | POA: Diagnosis not present

## 2020-04-27 DIAGNOSIS — G43909 Migraine, unspecified, not intractable, without status migrainosus: Secondary | ICD-10-CM | POA: Diagnosis not present

## 2020-04-27 MED ORDER — GABAPENTIN 100 MG PO CAPS: 100.0000 mg | ORAL_CAPSULE | Freq: Three times a day (TID) | ORAL | 3 refills | Status: DC

## 2020-04-27 NOTE — Patient Instructions (Signed)
1.  For nerve pain, start gabapentin 100mg  three times daily.  Contact me if we need to increase dose. 2.  We will check labs:  ANA, sed rate, SPEP/IFE, B6 3.  We will order a nerve conduction study (1 arm and 1 leg) 4.  Follow up in 4 months.

## 2020-05-03 ENCOUNTER — Other Ambulatory Visit: Payer: Self-pay

## 2020-05-04 ENCOUNTER — Other Ambulatory Visit: Payer: Self-pay

## 2020-05-05 ENCOUNTER — Other Ambulatory Visit: Payer: Self-pay

## 2020-05-14 DIAGNOSIS — M25561 Pain in right knee: Secondary | ICD-10-CM | POA: Diagnosis not present

## 2020-05-14 DIAGNOSIS — M25562 Pain in left knee: Secondary | ICD-10-CM | POA: Diagnosis not present

## 2020-05-14 DIAGNOSIS — M1711 Unilateral primary osteoarthritis, right knee: Secondary | ICD-10-CM | POA: Diagnosis not present

## 2020-05-14 DIAGNOSIS — M1712 Unilateral primary osteoarthritis, left knee: Secondary | ICD-10-CM | POA: Diagnosis not present

## 2020-05-17 ENCOUNTER — Other Ambulatory Visit: Payer: Self-pay

## 2020-05-17 MED ORDER — GABAPENTIN 100 MG PO CAPS: 100.0000 mg | ORAL_CAPSULE | Freq: Three times a day (TID) | ORAL | 3 refills | Status: DC

## 2020-05-17 NOTE — Telephone Encounter (Signed)
New Script fax recidved from [place].

## 2020-05-25 ENCOUNTER — Telehealth: Payer: Self-pay | Admitting: Neurology

## 2020-05-25 NOTE — Telephone Encounter (Signed)
Tried calling pt back no answer, Pt taking medication for pain per last note

## 2020-05-25 NOTE — Telephone Encounter (Signed)
Patient called and requested a call back regarding questions about her gabapentin 100 MG. She said she is not sure she needs to continue to take it and wants to be sure about the reason it was prescribed.

## 2020-05-26 DIAGNOSIS — R030 Elevated blood-pressure reading, without diagnosis of hypertension: Secondary | ICD-10-CM | POA: Diagnosis not present

## 2020-07-16 ENCOUNTER — Telehealth: Payer: Self-pay | Admitting: Neurology

## 2020-07-16 NOTE — Telephone Encounter (Signed)
Spoke with pt who was wanting to know what her scheduled appts were for. Told her that she has an EMG in August which is a test that checks for how the nerves are sending signals in her arm and leg because of her c/o tingling and cramping and that in Sept she has a f/u with Dr Tomi Likens to see how she is doing and to discuss EMG findings. She verbalized understanding of info given.

## 2020-07-16 NOTE — Telephone Encounter (Signed)
Patient called in wanting a nurse to call her back. She has some questions about her upcoming appointment.

## 2020-08-10 ENCOUNTER — Other Ambulatory Visit: Payer: Self-pay

## 2020-08-10 ENCOUNTER — Ambulatory Visit: Payer: Medicare HMO | Admitting: Neurology

## 2020-08-10 DIAGNOSIS — G609 Hereditary and idiopathic neuropathy, unspecified: Secondary | ICD-10-CM | POA: Diagnosis not present

## 2020-08-10 NOTE — Procedures (Signed)
[facility]  [place], [address]  [place], [place] [address] Tel: [phone] Fax:  [phone] Test Date:  08/10/2020  Patient: [name] DOB: 06-25-1941 Physician: Narda Amber, DO  Sex: Female Height: 5\' 4"  Ref Phys: Metta Clines, D.O.  ID#: [id number] Temp: 32.0C Technician:    Patient Complaints: This is a 79 year old female referred for evaluation of bilateral feet paresthesias and hand cramps.  NCV & EMG Findings: Extensive electrodiagnostic testing of the right upper and lower extremities as well as additional studies of the left lower extremity shows:  1. Bilateral sural sensory responses are absent.  Bilateral superficial peroneal, right median, right ulnar, and right mixed palmar sensory responses are within normal limits. 2. Bilateral peroneal motor responses at the extensor digitorum brevis are absent, and normal at the tibialis anterior.  Bilateral tibial motor responses showed reduced amplitude.  Right median and ulnar motor responses are within normal limits. 3. Right tibial H reflex study is within normal limits. 4. Chronic motor axonal loss changes are seen affecting the right flexor digitorum longus and medial gastrocnemius muscles, without accompanying active denervation.  Impression: 1. The electrophysiologic findings are consistent with a sensorimotor axonal polyneuropathy affecting the lower extremities, moderate. 2. There is no evidence of sensorimotor polyneuropathy, carpal tunnel syndrome, or cervical radiculopathy affecting the right upper extremity.   ___________________________ Narda Amber, DO    Nerve Conduction Studies Anti Sensory Summary Table   Stim Site NR Peak (ms) Norm Peak (ms) P-T Amp (V) Norm P-T Amp  Right Median Anti Sensory (2nd Digit)  32C  Wrist    3.4 <3.8 21.8 >10  Left Sup Peroneal Anti Sensory (Ant Lat Mall)  32C  12 cm    2.1 <4.6 5.9 >3  Right Sup Peroneal Anti Sensory (Ant Lat Mall)  32C  12 cm    3.1  <4.6 5.2 >3  Left Sural Anti Sensory (Lat Mall)  32C  Calf NR  <4.6  >3  Right Sural Anti Sensory (Lat Mall)  32C  Calf NR  <4.6  >3  Right Ulnar Anti Sensory (5th Digit)  32C  Wrist    2.9 <3.2 15.1 >5   Motor Summary Table   Stim Site NR Onset (ms) Norm Onset (ms) O-P Amp (mV) Norm O-P Amp Site1 Site2 Delta-0 (ms) Dist (cm) Vel (m/s) Norm Vel (m/s)  Right Median Motor (Abd Poll Brev)  32C  Wrist    3.0 <4.0 7.3 >5 Elbow Wrist 5.1 26.0 51 >50  Elbow    8.1  7.3         Left Peroneal Motor (Ext Dig Brev)  32C  Ankle NR  <6.0  >2.5 B Fib Ankle  0.0  >40  B Fib NR     Poplt B Fib  0.0  >40  Poplt NR            Right Peroneal Motor (Ext Dig Brev)  32C  Ankle NR  <6.0  >2.5 B Fib Ankle  0.0  >40  B Fib NR     Poplt B Fib  0.0  >40  Poplt NR            Left Peroneal TA Motor (Tib Ant)  32C  Fib Head    2.3 <4.5 5.7 >3 Poplit Fib Head 1.6 8.0 50 >40  Poplit    3.9  5.0         Right Peroneal TA Motor (Tib Ant)  32C  Fib Head  2.8 <4.5 6.4 >3 Poplit Fib Head 2.0 10.0 50 >40  Poplit    4.8  5.7         Left Tibial Motor (Abd Hall Brev)  32C  Ankle    5.1 <6.0 3.0 >4 Knee Ankle 8.1 39.0 48 >40  Knee    13.2  2.4         Right Tibial Motor (Abd Hall Brev)  32C  Ankle    4.3 <6.0 2.9 >4 Knee Ankle 8.4 40.0 48 >40  Knee    12.7  2.3         Right Ulnar Motor (Abd Dig Minimi)  32C  Wrist    2.2 <3.1 10.5 >7 B Elbow Wrist 3.8 22.0 58 >50  B Elbow    6.0  9.7  A Elbow B Elbow 1.8 10.0 56 >50  A Elbow    7.8  9.4          Comparison Summary Table   Stim Site NR Peak (ms) Norm Peak (ms) P-T Amp (V) Site1 Site2 Delta-P (ms) Norm Delta (ms)  Right Median/Ulnar Palm Comparison (Wrist - 8cm)  32C  Median Palm    1.9 <2.2 18.4 Median Palm Ulnar Palm 0.1   Ulnar Palm    2.0 <2.2 6.2       H Reflex Studies   NR H-Lat (ms) Lat Norm (ms) L-R H-Lat (ms)  Right Tibial (Gastroc)  32C     34.29 <35    EMG   Side Muscle Ins Act Fibs Psw Fasc Number Recrt Dur Dur. Amp Amp. Poly  Poly. Comment  Right AntTibialis Nml Nml Nml Nml 1- Rapid Some 1+ Some 1+ Some 1+ N/A  Right Gastroc Nml Nml Nml Nml Nml Nml Nml Nml Nml Nml Nml Nml N/A  Right Flex Dig Long Nml Nml Nml Nml 1- Rapid Some 1+ Some 1+ Some 1+ N/A  Right RectFemoris Nml Nml Nml Nml Nml Nml Nml Nml Nml Nml Nml Nml N/A  Right GluteusMed Nml Nml Nml Nml Nml Nml Nml Nml Nml Nml Nml Nml N/A  Right BicepsFemS Nml Nml Nml Nml Nml Nml Nml Nml Nml Nml Nml Nml N/A  Right 1stDorInt Nml Nml Nml Nml Nml Nml Nml Nml Nml Nml Nml Nml N/A  Right PronatorTeres Nml Nml Nml Nml Nml Nml Nml Nml Nml Nml Nml Nml N/A  Right Biceps Nml Nml Nml Nml Nml Nml Nml Nml Nml Nml Nml Nml N/A  Right Triceps Nml Nml Nml Nml Nml Nml Nml Nml Nml Nml Nml Nml N/A  Right Deltoid Nml Nml Nml Nml Nml Nml Nml Nml Nml Nml Nml Nml N/A      Waveforms:

## 2020-09-06 NOTE — Progress Notes (Deleted)
NEUROLOGY FOLLOW UP OFFICE NOTE  Lakenzie Mcclafferty [place] [id number]  HISTORY OF PRESENT ILLNESS: [name] is a 79 year old right-handed white female with hypothyroidism and arthritis who follows up for polyneuropathy.  UPDATE: For pain, she was started on gabapentin 100mg  TID ***  She underwent NCV-EMG of the bilateral lower extremities on 08/10/2020 which showed moderate sensorimotor axonal polyneuropathy.  Labs were ordered but not performed.  HISTORY: She reports pain in both feet since at least 2020, which has progressed.  They burn whenever she walks.  Sometimes they tingle.   She sometimes has non-radiating back pain.  She has cramps in the legs but no radicular pain.  She also reports cramps in her fingers as well.     She reports that her mother used to complain of pain in the feet.  She does not have diabetes.  Her thyroid is controlled.  03/08/2020 LABS:  Glucose 92; vitamin D 10.6; TSH 1.73, free T4 90  PAST MEDICAL HISTORY: Past Medical History:  Diagnosis Date  . Allergic rhinitis   . Arthritis   . Cataract   . Depression   . GERD (gastroesophageal reflux disease)   . Hemorrhoids   . Hypothyroidism   . Lichen planus   . Migraine headache   . Personal history of colonic adenoma 05/15/2013  . Retinal wrinkling     MEDICATIONS: Current Outpatient Medications on File Prior to Visit  Medication Sig Dispense Refill  . azithromycin (ZITHROMAX) 500 MG tablet azithromycin 500 mg tablet    . BIOTIN PO Take 1 tablet by mouth daily.    . cephALEXin (KEFLEX) 500 MG capsule cephalexin 500 mg capsule    . fluticasone (FLONASE) 50 MCG/ACT nasal spray fluticasone propionate 50 mcg/actuation nasal spray,suspension    . gabapentin (NEURONTIN) 100 MG capsule Take 1 capsule (100 mg total) by mouth 3 (three) times daily. 90 capsule 3  . HYDROcodone-acetaminophen (NORCO/VICODIN) 5-325 MG tablet hydrocodone 5 mg-acetaminophen 325 mg tablet    . hydrOXYzine (ATARAX/VISTARIL) 25  MG tablet hydroxyzine HCl 25 mg tablet    . levothyroxine (SYNTHROID, LEVOTHROID) 112 MCG tablet Take 56 mcg by mouth daily.     Marland Kitchen loratadine (CLARITIN) 10 MG tablet Take 10 mg by mouth daily.    Marland Kitchen METRONIDAZOLE, TOPICAL, 0.75 % LOTN Apply 1 application topically 2 (two) times daily.    . mometasone (ELOCON) 0.1 % lotion mometasone 0.1 % topical solution    . naproxen (NAPROSYN) 375 MG tablet Take 1 tablet (375 mg total) by mouth 2 (two) times daily. 20 tablet 0  . nitrofurantoin (MACRODANTIN) 100 MG capsule nitrofurantoin macrocrystal 100 mg capsule    . Probiotic Product (PROBIOTIC ADVANCED) CAPS Take by mouth daily.    Marland Kitchen venlafaxine (EFFEXOR) 75 MG tablet Take 75 mg by mouth daily.     No current facility-administered medications on file prior to visit.    ALLERGIES: Allergies  Allergen Reactions  . Levofloxacin Nausea And Vomiting    FAMILY HISTORY: Family History  Problem Relation Age of Onset  . Coronary artery disease Father   . Diabetes Sister   . Coronary artery disease Mother   . Coronary artery disease Brother   . Colon cancer Brother   . Diabetes Brother     SOCIAL HISTORY: Social History   Socioeconomic History  . Marital status: Married    Spouse name: Not on file  . Number of children: Not on file  . Years of education: Not on file  .  Highest education level: Not on file  Occupational History  . Occupation: retired  Tobacco Use  . Smoking status: Never Smoker  . Smokeless tobacco: Never Used  Substance and Sexual Activity  . Alcohol use: No  . Drug use: No  . Sexual activity: Never  Other Topics Concern  . 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 Difficulty of Paying Living Expenses: Not on file  Food Insecurity:   . Worried About Charity fundraiser in the Last Year: Not on file  . Ran Out of  Food in the Last Year: Not on file  Transportation Needs:   . Lack of Transportation (Medical): Not on file  . Lack of Transportation (Non-Medical): Not on file  Physical Activity:   . Days of Exercise per Week: Not on file  . Minutes of Exercise per Session: Not on file  Stress:   . Feeling of Stress : Not on file  Social Connections:   . Frequency of Communication with Friends and Family: Not on file  . Frequency of Social Gatherings with Friends and Family: Not on file  . Attends Religious Services: Not on file  . Active Member of Clubs or Organizations: Not on file  . Attends Archivist Meetings: Not on file  . Marital Status: Not on file  Intimate Partner Violence:   . Fear of Current or Ex-Partner: Not on file  . Emotionally Abused: Not on file  . Physically Abused: Not on file  . Sexually Abused: Not on file    PHYSICAL EXAM: *** General: No acute distress.  Patient appears well-groomed.   Head:  Normocephalic/atraumatic Eyes:  Fundi examined but not visualized Neck: supple, no paraspinal tenderness, full range of motion Heart:  Regular rate and rhythm Lungs:  Clear to auscultation bilaterally Back: No paraspinal tenderness Neurological Exam: alert and oriented to person, place, and time. Attention span and concentration intact, recent and remote memory intact, fund of knowledge intact.  Speech fluent and not dysarthric, language intact.  CN II-XII intact. Bulk and tone normal, muscle strength 5/5 throughout.  Sensation to light touch, temperature and vibration intact.  Deep tendon reflexes 2+ throughout, toes downgoing.  Finger to nose and heel to shin testing intact.  Mildly wide-based gait.  Able to turn.  Romberg negative.  IMPRESSION: Idiopathic polyneuropathy  PLAN: 1.  *** 2.  Check ANA, sed rate, SPEP/IFE, B6, B12, ACE 3.  Follow up ***  Metta Clines, DO  CC: Prince Solian, MD

## 2020-09-07 ENCOUNTER — Ambulatory Visit: Payer: Medicare HMO | Admitting: Neurology

## 2020-09-10 DIAGNOSIS — Z20822 Contact with and (suspected) exposure to covid-19: Secondary | ICD-10-CM | POA: Diagnosis not present

## 2020-10-04 DIAGNOSIS — Z1152 Encounter for screening for COVID-19: Secondary | ICD-10-CM | POA: Diagnosis not present

## 2020-10-04 DIAGNOSIS — R059 Cough, unspecified: Secondary | ICD-10-CM | POA: Diagnosis not present

## 2020-10-04 DIAGNOSIS — J309 Allergic rhinitis, unspecified: Secondary | ICD-10-CM | POA: Diagnosis not present

## 2020-10-07 NOTE — Progress Notes (Signed)
NEUROLOGY FOLLOW UP OFFICE NOTE  [name] [id number]  HISTORY OF PRESENT ILLNESS: [name] is a 79 year old right-handed white female with hypothyroidism and arthritis who follows up for neuropathy.  UPDATE: Started gabapentin 100mg  two times daily for neuralgia (can't remember to take the third dose).  Also taking hydrocodone-acetaminophen. Other medications include venlafaxine 75mg  daily for depression and anxiety.Marland Kitchen  NCV-EMG of lower extremities from 08/10/2020 showed moderate sensorimotor axonal polyneuropathy.   Neuropathy workup labs were ordered but not performed.    Gabapentin 100mg  twice daily not effective but would like to discontinue as it is contributing to weight gain.  Pain mostly noticeable with inactivity, such as at night.  It does not disrupt sleep.  She would rather avoid medications and thinks she will just tolerate the pain.     HISTORY: She reports pain in both feet since at least 2020, which has progressed.  They burn whenever she walks.  Sometimes they tingle.   She sometimes has non-radiating back pain.  She has cramps in the legs but no radicular pain.  She also reports cramps in her fingers as well.     She reports that her mother used to complain of pain in the feet.  She does not have diabetes.  Her thyroid is controlled.  03/08/2020  TSH 1.73, free T4 90  PAST MEDICAL HISTORY: Past Medical History:  Diagnosis Date  . Allergic rhinitis   . Arthritis   . Cataract   . Depression   . GERD (gastroesophageal reflux disease)   . Hemorrhoids   . Hypothyroidism   . Lichen planus   . Migraine headache   . Personal history of colonic adenoma 05/15/2013  . Retinal wrinkling     MEDICATIONS: Current Outpatient Medications on File Prior to Visit  Medication Sig Dispense Refill  . azithromycin (ZITHROMAX) 500 MG tablet azithromycin 500 mg tablet    . BIOTIN PO Take 1 tablet by mouth daily.    . cephALEXin (KEFLEX) 500 MG capsule  cephalexin 500 mg capsule    . fluticasone (FLONASE) 50 MCG/ACT nasal spray fluticasone propionate 50 mcg/actuation nasal spray,suspension    . gabapentin (NEURONTIN) 100 MG capsule Take 1 capsule (100 mg total) by mouth 3 (three) times daily. 90 capsule 3  . HYDROcodone-acetaminophen (NORCO/VICODIN) 5-325 MG tablet hydrocodone 5 mg-acetaminophen 325 mg tablet    . hydrOXYzine (ATARAX/VISTARIL) 25 MG tablet hydroxyzine HCl 25 mg tablet    . levothyroxine (SYNTHROID, LEVOTHROID) 112 MCG tablet Take 56 mcg by mouth daily.     Marland Kitchen loratadine (CLARITIN) 10 MG tablet Take 10 mg by mouth daily.    Marland Kitchen METRONIDAZOLE, TOPICAL, 0.75 % LOTN Apply 1 application topically 2 (two) times daily.    . mometasone (ELOCON) 0.1 % lotion mometasone 0.1 % topical solution    . naproxen (NAPROSYN) 375 MG tablet Take 1 tablet (375 mg total) by mouth 2 (two) times daily. 20 tablet 0  . nitrofurantoin (MACRODANTIN) 100 MG capsule nitrofurantoin macrocrystal 100 mg capsule    . Probiotic Product (PROBIOTIC ADVANCED) CAPS Take by mouth daily.    Marland Kitchen venlafaxine (EFFEXOR) 75 MG tablet Take 75 mg by mouth daily.     No current facility-administered medications on file prior to visit.    ALLERGIES: Allergies  Allergen Reactions  . Levofloxacin Nausea And Vomiting    FAMILY HISTORY: Family History  Problem Relation Age of Onset  . Coronary artery disease Father   . Diabetes Sister   .  Coronary artery disease Mother   . Coronary artery disease Brother   . Colon cancer Brother   . Diabetes Brother     SOCIAL HISTORY: Social History   Socioeconomic History  . Marital status: Married    Spouse name: Not on file  . Number of children: Not on file  . Years of education: Not on file  . Highest education level: Not on file  Occupational History  . Occupation: retired  Tobacco Use  . Smoking status: Never Smoker  . Smokeless tobacco: Never Used  Substance and Sexual Activity  . Alcohol use: No  . Drug use: No   . Sexual activity: Never  Other Topics Concern  . 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 Difficulty of Paying Living Expenses: Not on file  Food Insecurity:   . Worried About Charity fundraiser in the Last Year: Not on file  . Ran Out of Food in the Last Year: Not on file  Transportation Needs:   . Lack of Transportation (Medical): Not on file  . Lack of Transportation (Non-Medical): Not on file  Physical Activity:   . Days of Exercise per Week: Not on file  . Minutes of Exercise per Session: Not on file  Stress:   . Feeling of Stress : Not on file  Social Connections:   . Frequency of Communication with Friends and Family: Not on file  . Frequency of Social Gatherings with Friends and Family: Not on file  . Attends Religious Services: Not on file  . Active Member of Clubs or Organizations: Not on file  . Attends Archivist Meetings: Not on file  . Marital Status: Not on file  Intimate Partner Violence:   . Fear of Current or Ex-Partner: Not on file  . Emotionally Abused: Not on file  . Physically Abused: Not on file  . Sexually Abused: Not on file    PHYSICAL EXAM: Blood pressure 125/84, pulse 73, height 5\' 4"  (1.626 m), weight 155 lb (70.3 kg), SpO2 94 %. General: No acute distress.  Patient appears well-groomed.     IMPRESSION: Idiopathic polyneuropathy.  She would like to stop gabapentin and not add any new medications at this time.  PLAN: 1.  Stop gabapentin.  I recommend discussing with Dr. Dagmar Byrd about switching from Effexor to Cymbalta.  They are both SNRIs but Cymbalta may also help with neuralgia.  She also wouldn't be adding any new medication to her list. 2.  Check labs for other causes of neuropathy:  ANA, sed rate, B6, B12, ACE, SPEP/IFE.  Further recommendations pending results. 3.  If  switching to cymbalta is not effective and if pain becomes worse, she was advised to follow up.  Metta Clines, DO  CC:  Molly Solian, MD

## 2020-10-08 ENCOUNTER — Encounter: Payer: Self-pay | Admitting: Neurology

## 2020-10-08 ENCOUNTER — Other Ambulatory Visit (INDEPENDENT_AMBULATORY_CARE_PROVIDER_SITE_OTHER): Payer: Medicare HMO

## 2020-10-08 ENCOUNTER — Ambulatory Visit: Payer: Medicare HMO | Admitting: Neurology

## 2020-10-08 ENCOUNTER — Other Ambulatory Visit: Payer: Self-pay

## 2020-10-08 VITALS — BP 125/84 | HR 73 | Ht 64.0 in | Wt 155.0 lb

## 2020-10-08 DIAGNOSIS — G609 Hereditary and idiopathic neuropathy, unspecified: Secondary | ICD-10-CM | POA: Diagnosis not present

## 2020-10-08 LAB — VITAMIN B12: Vitamin B-12: >1526 pg/mL — ABNORMAL HIGH (ref 211–911)

## 2020-10-08 LAB — SEDIMENTATION RATE: Sed Rate: 14 mm/hr (ref 0–30)

## 2020-10-08 NOTE — Patient Instructions (Signed)
1.  Discuss with Dr. Dagmar Hait about switching Effexor to Cymbalta.  They are both in the same family, but Cymbalta is shown to be effective in treating nerve pain. 2.  Check ANA, Sed Rate, ACE, B6, B12, SPEP/IFE 3.  Follow up if you require change in pain management

## 2020-10-09 LAB — ENA+DNA/DS+SJORGEN'S
ENA RNP Ab: 1 AI — ABNORMAL HIGH (ref 0.0–0.9)
ENA SM Ab Ser-aCnc: <0.2 AI (ref 0.0–0.9)
ENA SSA (RO) Ab: <0.2 AI (ref 0.0–0.9)
ENA SSB (LA) Ab: <0.2 AI (ref 0.0–0.9)
dsDNA Ab: <1 IU/mL (ref 0–9)

## 2020-10-09 LAB — ANA W/REFLEX: Anti Nuclear Antibody (ANA): POSITIVE — AB

## 2020-10-14 ENCOUNTER — Telehealth: Payer: Self-pay

## 2020-10-14 LAB — ANGIOTENSIN CONVERTING ENZYME: Angiotensin-Converting Enzyme: 11 U/L (ref 9–67)

## 2020-10-14 LAB — IMMUNOFIXATION ELECTROPHORESIS
IgG (Immunoglobin G), Serum: 1226 mg/dL (ref 600–1540)
IgM, Serum: 57 mg/dL (ref 50–300)
Immunofix Electr Int: NOT DETECTED
Immunoglobulin A: 154 mg/dL (ref 70–320)

## 2020-10-14 LAB — PROTEIN ELECTROPHORESIS, SERUM
Albumin ELP: 4.1 g/dL (ref 3.8–4.8)
Alpha 1: 0.3 g/dL (ref 0.2–0.3)
Alpha 2: 0.7 g/dL (ref 0.5–0.9)
Beta 2: 0.3 g/dL (ref 0.2–0.5)
Beta Globulin: 0.4 g/dL (ref 0.4–0.6)
Gamma Globulin: 1.1 g/dL (ref 0.8–1.7)
Total Protein: 6.9 g/dL (ref 6.1–8.1)

## 2020-10-14 NOTE — Telephone Encounter (Signed)
-----   Message from Pieter Partridge, DO sent at 10/12/2020  7:24 AM EDT ----- Reviewed labs for causes of neuropathy.  Unrevealing.  Her ANA is positive.  This is a nonspecific test that may suggest underlying autoimmune disorder.  However, some people have a positive ANA of no clinical significance.  I believe this is likely of no clinical significance as specific tests for autoimmune disorders are negative or unremarkable.  There is only one test pending.  If that is positive, we will let patient know.

## 2020-10-14 NOTE — Telephone Encounter (Signed)
Called patient and unable to leave voicemail.  

## 2020-10-15 NOTE — Telephone Encounter (Signed)
Called patient and informed her of results. Patient verbalized understanding and had no questions or concerns.  

## 2020-10-15 NOTE — Telephone Encounter (Signed)
-----   Message from Pieter Partridge, DO sent at 10/12/2020  7:24 AM EDT ----- Reviewed labs for causes of neuropathy.  Unrevealing.  Her ANA is positive.  This is a nonspecific test that may suggest underlying autoimmune disorder.  However, some people have a positive ANA of no clinical significance.  I believe this is likely of no clinical significance as specific tests for autoimmune disorders are negative or unremarkable.  There is only one test pending.  If that is positive, we will let patient know.

## 2020-10-25 ENCOUNTER — Ambulatory Visit: Payer: Medicare HMO | Admitting: Neurology

## 2020-10-26 DIAGNOSIS — R059 Cough, unspecified: Secondary | ICD-10-CM | POA: Diagnosis not present

## 2020-10-26 DIAGNOSIS — G629 Polyneuropathy, unspecified: Secondary | ICD-10-CM | POA: Diagnosis not present

## 2020-10-26 DIAGNOSIS — F39 Unspecified mood [affective] disorder: Secondary | ICD-10-CM | POA: Diagnosis not present

## 2020-10-26 DIAGNOSIS — K219 Gastro-esophageal reflux disease without esophagitis: Secondary | ICD-10-CM | POA: Diagnosis not present

## 2020-10-26 DIAGNOSIS — G43909 Migraine, unspecified, not intractable, without status migrainosus: Secondary | ICD-10-CM | POA: Diagnosis not present

## 2020-10-26 DIAGNOSIS — R030 Elevated blood-pressure reading, without diagnosis of hypertension: Secondary | ICD-10-CM | POA: Diagnosis not present

## 2020-10-26 DIAGNOSIS — E039 Hypothyroidism, unspecified: Secondary | ICD-10-CM | POA: Diagnosis not present

## 2020-10-26 DIAGNOSIS — E785 Hyperlipidemia, unspecified: Secondary | ICD-10-CM | POA: Diagnosis not present

## 2020-10-26 DIAGNOSIS — Z8249 Family history of ischemic heart disease and other diseases of the circulatory system: Secondary | ICD-10-CM | POA: Diagnosis not present

## 2020-11-22 DIAGNOSIS — M25562 Pain in left knee: Secondary | ICD-10-CM | POA: Diagnosis not present

## 2020-11-22 DIAGNOSIS — M8580 Other specified disorders of bone density and structure, unspecified site: Secondary | ICD-10-CM | POA: Diagnosis not present

## 2020-11-22 DIAGNOSIS — R030 Elevated blood-pressure reading, without diagnosis of hypertension: Secondary | ICD-10-CM | POA: Diagnosis not present

## 2020-11-22 DIAGNOSIS — F39 Unspecified mood [affective] disorder: Secondary | ICD-10-CM | POA: Diagnosis not present

## 2020-11-22 DIAGNOSIS — Z9049 Acquired absence of other specified parts of digestive tract: Secondary | ICD-10-CM | POA: Diagnosis not present

## 2020-11-22 DIAGNOSIS — G629 Polyneuropathy, unspecified: Secondary | ICD-10-CM | POA: Diagnosis not present

## 2020-11-22 DIAGNOSIS — R202 Paresthesia of skin: Secondary | ICD-10-CM | POA: Diagnosis not present

## 2021-01-07 DIAGNOSIS — Z1231 Encounter for screening mammogram for malignant neoplasm of breast: Secondary | ICD-10-CM | POA: Diagnosis not present

## 2021-02-18 DIAGNOSIS — D225 Melanocytic nevi of trunk: Secondary | ICD-10-CM | POA: Diagnosis not present

## 2021-02-18 DIAGNOSIS — L820 Inflamed seborrheic keratosis: Secondary | ICD-10-CM | POA: Diagnosis not present

## 2021-02-18 DIAGNOSIS — D485 Neoplasm of uncertain behavior of skin: Secondary | ICD-10-CM | POA: Diagnosis not present

## 2021-02-18 DIAGNOSIS — L821 Other seborrheic keratosis: Secondary | ICD-10-CM | POA: Diagnosis not present

## 2021-02-18 DIAGNOSIS — L738 Other specified follicular disorders: Secondary | ICD-10-CM | POA: Diagnosis not present

## 2021-02-18 DIAGNOSIS — D1801 Hemangioma of skin and subcutaneous tissue: Secondary | ICD-10-CM | POA: Diagnosis not present

## 2021-02-18 DIAGNOSIS — L603 Nail dystrophy: Secondary | ICD-10-CM | POA: Diagnosis not present

## 2021-03-07 DIAGNOSIS — Z961 Presence of intraocular lens: Secondary | ICD-10-CM | POA: Diagnosis not present

## 2021-03-07 DIAGNOSIS — H35373 Puckering of macula, bilateral: Secondary | ICD-10-CM | POA: Diagnosis not present

## 2021-03-07 DIAGNOSIS — H524 Presbyopia: Secondary | ICD-10-CM | POA: Diagnosis not present

## 2021-04-06 DIAGNOSIS — E039 Hypothyroidism, unspecified: Secondary | ICD-10-CM | POA: Diagnosis not present

## 2021-04-06 DIAGNOSIS — E785 Hyperlipidemia, unspecified: Secondary | ICD-10-CM | POA: Diagnosis not present

## 2021-04-13 DIAGNOSIS — K219 Gastro-esophageal reflux disease without esophagitis: Secondary | ICD-10-CM | POA: Diagnosis not present

## 2021-04-13 DIAGNOSIS — E785 Hyperlipidemia, unspecified: Secondary | ICD-10-CM | POA: Diagnosis not present

## 2021-04-13 DIAGNOSIS — Z0000 Encounter for general adult medical examination without abnormal findings: Secondary | ICD-10-CM | POA: Diagnosis not present

## 2021-04-13 DIAGNOSIS — R062 Wheezing: Secondary | ICD-10-CM | POA: Diagnosis not present

## 2021-04-13 DIAGNOSIS — E039 Hypothyroidism, unspecified: Secondary | ICD-10-CM | POA: Diagnosis not present

## 2021-04-13 DIAGNOSIS — M25579 Pain in unspecified ankle and joints of unspecified foot: Secondary | ICD-10-CM | POA: Diagnosis not present

## 2021-04-13 DIAGNOSIS — R030 Elevated blood-pressure reading, without diagnosis of hypertension: Secondary | ICD-10-CM | POA: Diagnosis not present

## 2021-04-13 DIAGNOSIS — F39 Unspecified mood [affective] disorder: Secondary | ICD-10-CM | POA: Diagnosis not present

## 2021-04-13 DIAGNOSIS — R82998 Other abnormal findings in urine: Secondary | ICD-10-CM | POA: Diagnosis not present

## 2021-04-13 DIAGNOSIS — G629 Polyneuropathy, unspecified: Secondary | ICD-10-CM | POA: Diagnosis not present

## 2021-04-19 DIAGNOSIS — M1712 Unilateral primary osteoarthritis, left knee: Secondary | ICD-10-CM | POA: Diagnosis not present

## 2021-04-19 DIAGNOSIS — M170 Bilateral primary osteoarthritis of knee: Secondary | ICD-10-CM | POA: Diagnosis not present

## 2021-05-13 ENCOUNTER — Encounter: Payer: Self-pay | Admitting: Internal Medicine

## 2021-05-13 ENCOUNTER — Ambulatory Visit: Payer: Medicare HMO | Admitting: Internal Medicine

## 2021-05-13 VITALS — BP 128/82 | HR 68 | Ht 64.0 in | Wt 154.4 lb

## 2021-05-13 DIAGNOSIS — K649 Unspecified hemorrhoids: Secondary | ICD-10-CM

## 2021-05-13 DIAGNOSIS — Z86010 Personal history of colonic polyps: Secondary | ICD-10-CM | POA: Diagnosis not present

## 2021-05-13 NOTE — Patient Instructions (Signed)
We are providing you with a hemorrhoid handout to read.  Please do not do the stool test at your PCP office.  The [facility] providers would like to encourage you to use [facility] to communicate with providers for non-urgent requests or questions.  Due to long hold times on the telephone, sending your provider a message by [facility] may be a faster and more efficient way to get a response.  Please allow 48 business hours for a response.  Please remember that this is for non-urgent requests.     I appreciate the opportunity to care for you. Silvano Rusk, MD, [facility]

## 2021-05-13 NOTE — Progress Notes (Signed)
Kenza Munar Sterbenz 80 y.o. Dec 14, 1941 [id number]  Assessment & Plan:   Encounter Diagnoses  Name Primary?  Marland Kitchen Hx of adenomatous polyp of colon Yes  . Bleeding hemorrhoids     She and I do not think repeat routine colonoscopy makes sense.  I think the rectal bleeding has been hemorrhoids based upon history and exam today.  Should this worsen or change we can reconsider a diagnostic colonoscopy.  She will stay on her stool softener and see me as needed  She should not do routine Hemoccults I have told her to declined those   I appreciate the opportunity to care for this patient. CC: Avva, Ravisankar, MD   Subjective:   Chief Complaint: Question repeat routine colonoscopy, also chronic recurrent rectal bleeding  HPI Patient is here to discuss repeating a colonoscopy in the setting of a family history of colon cancer in her brother older than 64 and a 5 mm adenoma removed in 2014.  Recall letter was sent in 2019 to schedule an office visit.  Hemorrhoids have been seen on previous colonoscopy and in fact a 24 indication was rectal bleeding.  Wt Readings from Last 3 Encounters:  05/13/21 154 lb 6.4 oz (70 kg)  10/08/20 155 lb (70.3 kg)  04/27/20 151 lb 14.4 oz (68.9 kg)   She does have known hemorrhoids and will have rectal bleeding off and on about a month ago she had more than usual but was all bright red.  When she has a hard stools will see a little bit on the toilet paper.  This is really unchanged over time.  Hemoglobin was 14 a year ago.  No bowel habit changes.  Constipation issues treated with a stool softener are chronic.  Allergies  Allergen Reactions  . Clindamycin   . Levofloxacin Nausea And Vomiting   Current Meds  Medication Sig  . Biotin 800 MCG TABS Take 800 tablets by mouth daily.  . Cholecalciferol (VITAMIN D3) 50 MCG (2000 UT) capsule Take 2,000 Units by mouth daily.  . clotrimazole (MYCELEX) 10 MG troche Take 10 mg by mouth as needed.  . diclofenac  Sodium (VOLTAREN) 1 % GEL Apply 4 g topically 3 (three) times daily as needed.  . DULoxetine (CYMBALTA) 30 MG capsule Take 90 mg by mouth daily.  . fluticasone (FLONASE) 50 MCG/ACT nasal spray fluticasone propionate 50 mcg/actuation nasal spray,suspension  . loratadine (CLARITIN) 10 MG tablet Take 10 mg by mouth daily.  . Probiotic Product (PROBIOTIC ADVANCED) CAPS Take by mouth daily.   Past Medical History:  Diagnosis Date  . Allergic rhinitis   . Arthritis   . Cataract   . Depression   . GERD (gastroesophageal reflux disease)   . Hemorrhoids   . Hypothyroidism   . Lichen planus   . Migraine headache   . Personal history of colonic adenoma 05/15/2013  . Retinal wrinkling    Past Surgical History:  Procedure Laterality Date  . CATARACT EXTRACTION     Dr. Zadie Rhine  . CHOLECYSTECTOMY  04/04/2012   Procedure: LAPAROSCOPIC CHOLECYSTECTOMY WITH INTRAOPERATIVE CHOLANGIOGRAM;  Surgeon: Zenovia Jarred, MD;  Location: [facility];  Service: General;  Laterality: N/A;  . COLONOSCOPY    . Retina Wrinkle with surgery  2006, 2013  . 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   Review of Systems As per HPI, multiple joint aches otherwise negative  Objective:   Physical Exam BP 128/82   Pulse 68   Ht 5\' 4"  (1.626 m)   Wt 154 lb 6.4 oz (70 kg)   SpO2 91%   BMI 26.50 kg/m  NAD elderly white woman  Female staff present  Rectal exam shows a protruding soft left-sided anterior external hemorrhoid tag complex digital exam without mass or rectocele soft brown stool   Anoscopy demonstrates mildly inflamed external hemorrhoids in all positions grade 1 and grade 1 internal hemorrhoids in all positions no proctitis

## 2021-06-29 DIAGNOSIS — K635 Polyp of colon: Secondary | ICD-10-CM | POA: Diagnosis not present

## 2021-06-29 DIAGNOSIS — G629 Polyneuropathy, unspecified: Secondary | ICD-10-CM | POA: Diagnosis not present

## 2021-06-29 DIAGNOSIS — Z8249 Family history of ischemic heart disease and other diseases of the circulatory system: Secondary | ICD-10-CM | POA: Diagnosis not present

## 2021-06-29 DIAGNOSIS — R030 Elevated blood-pressure reading, without diagnosis of hypertension: Secondary | ICD-10-CM | POA: Diagnosis not present

## 2021-06-29 DIAGNOSIS — E039 Hypothyroidism, unspecified: Secondary | ICD-10-CM | POA: Diagnosis not present

## 2021-06-29 DIAGNOSIS — F39 Unspecified mood [affective] disorder: Secondary | ICD-10-CM | POA: Diagnosis not present

## 2021-06-29 DIAGNOSIS — J309 Allergic rhinitis, unspecified: Secondary | ICD-10-CM | POA: Diagnosis not present

## 2021-06-29 DIAGNOSIS — K219 Gastro-esophageal reflux disease without esophagitis: Secondary | ICD-10-CM | POA: Diagnosis not present

## 2021-06-29 DIAGNOSIS — E785 Hyperlipidemia, unspecified: Secondary | ICD-10-CM | POA: Diagnosis not present

## 2021-09-09 DIAGNOSIS — G629 Polyneuropathy, unspecified: Secondary | ICD-10-CM | POA: Diagnosis not present

## 2021-09-09 DIAGNOSIS — J45901 Unspecified asthma with (acute) exacerbation: Secondary | ICD-10-CM | POA: Diagnosis not present

## 2021-09-09 DIAGNOSIS — K219 Gastro-esophageal reflux disease without esophagitis: Secondary | ICD-10-CM | POA: Diagnosis not present

## 2021-09-09 DIAGNOSIS — E785 Hyperlipidemia, unspecified: Secondary | ICD-10-CM | POA: Diagnosis not present

## 2021-09-09 DIAGNOSIS — E039 Hypothyroidism, unspecified: Secondary | ICD-10-CM | POA: Diagnosis not present

## 2021-09-09 DIAGNOSIS — F39 Unspecified mood [affective] disorder: Secondary | ICD-10-CM | POA: Diagnosis not present

## 2021-09-09 DIAGNOSIS — R030 Elevated blood-pressure reading, without diagnosis of hypertension: Secondary | ICD-10-CM | POA: Diagnosis not present

## 2021-09-09 DIAGNOSIS — K635 Polyp of colon: Secondary | ICD-10-CM | POA: Diagnosis not present

## 2021-09-09 DIAGNOSIS — Z8249 Family history of ischemic heart disease and other diseases of the circulatory system: Secondary | ICD-10-CM | POA: Diagnosis not present

## 2021-11-07 ENCOUNTER — Other Ambulatory Visit (HOSPITAL_COMMUNITY): Payer: Self-pay | Admitting: Radiology

## 2021-11-07 DIAGNOSIS — J45901 Unspecified asthma with (acute) exacerbation: Secondary | ICD-10-CM

## 2021-11-08 DIAGNOSIS — E785 Hyperlipidemia, unspecified: Secondary | ICD-10-CM | POA: Diagnosis not present

## 2021-11-08 DIAGNOSIS — M25579 Pain in unspecified ankle and joints of unspecified foot: Secondary | ICD-10-CM | POA: Diagnosis not present

## 2021-11-08 DIAGNOSIS — E039 Hypothyroidism, unspecified: Secondary | ICD-10-CM | POA: Diagnosis not present

## 2021-11-08 DIAGNOSIS — G629 Polyneuropathy, unspecified: Secondary | ICD-10-CM | POA: Diagnosis not present

## 2021-11-08 DIAGNOSIS — F39 Unspecified mood [affective] disorder: Secondary | ICD-10-CM | POA: Diagnosis not present

## 2021-11-08 DIAGNOSIS — J441 Chronic obstructive pulmonary disease with (acute) exacerbation: Secondary | ICD-10-CM | POA: Diagnosis not present

## 2021-11-08 DIAGNOSIS — J45901 Unspecified asthma with (acute) exacerbation: Secondary | ICD-10-CM | POA: Diagnosis not present

## 2021-11-08 DIAGNOSIS — Z8249 Family history of ischemic heart disease and other diseases of the circulatory system: Secondary | ICD-10-CM | POA: Diagnosis not present

## 2021-11-08 DIAGNOSIS — R030 Elevated blood-pressure reading, without diagnosis of hypertension: Secondary | ICD-10-CM | POA: Diagnosis not present

## 2021-11-21 ENCOUNTER — Other Ambulatory Visit: Payer: Self-pay | Admitting: Internal Medicine

## 2021-11-21 LAB — SARS CORONAVIRUS 2 (TAT 6-24 HRS): SARS Coronavirus 2: NEGATIVE

## 2021-11-24 ENCOUNTER — Inpatient Hospital Stay (HOSPITAL_COMMUNITY): Admission: RE | Admit: 2021-11-24 | Payer: Medicare HMO | Source: Ambulatory Visit

## 2021-11-25 ENCOUNTER — Ambulatory Visit (HOSPITAL_COMMUNITY)
Admission: RE | Admit: 2021-11-25 | Discharge: 2021-11-25 | Disposition: A | Discharge status: Home / Self Care | Payer: Medicare HMO | Source: Ambulatory Visit | Attending: Internal Medicine | Admitting: Internal Medicine

## 2021-11-25 ENCOUNTER — Other Ambulatory Visit: Payer: Self-pay

## 2021-11-25 DIAGNOSIS — J45901 Unspecified asthma with (acute) exacerbation: Secondary | ICD-10-CM | POA: Diagnosis not present

## 2021-11-25 LAB — PULMONARY FUNCTION TEST
DL/VA % pred: 110 %
DL/VA: 4.49 ml/min/mmHg/L
DLCO unc % pred: 81 %
DLCO unc: 15.34 ml/min/mmHg
FEF 25-75 Post: 1.16 L/sec
FEF 25-75 Pre: 1.19 L/sec
FEF2575-%Change-Post: -2 %
FEF2575-%Pred-Post: 82 %
FEF2575-%Pred-Pre: 84 %
FEV1-%Change-Post: -12 %
FEV1-%Pred-Post: 69 %
FEV1-%Pred-Pre: 79 %
FEV1-Post: 1.35 L
FEV1-Pre: 1.55 L
FEV1FVC-%Change-Post: -12 %
FEV1FVC-%Pred-Pre: 111 %
FEV6-%Change-Post: -1 %
FEV6-%Pred-Post: 75 %
FEV6-%Pred-Pre: 76 %
FEV6-Post: 1.85 L
FEV6-Pre: 1.88 L
FEV6FVC-%Change-Post: 0 %
FEV6FVC-%Pred-Post: 105 %
FEV6FVC-%Pred-Pre: 105 %
FVC-%Change-Post: 0 %
FVC-%Pred-Post: 72 %
FVC-%Pred-Pre: 72 %
FVC-Post: 1.88 L
FVC-Pre: 1.88 L
Post FEV1/FVC ratio: 72 %
Post FEV6/FVC ratio: 100 %
Pre FEV1/FVC ratio: 82 %
Pre FEV6/FVC Ratio: 100 %
RV % pred: 44 %
RV: 1.06 L
TLC % pred: 60 %
TLC: 3.03 L

## 2021-11-25 MED ORDER — ALBUTEROL SULFATE (2.5 MG/3ML) 0.083% IN NEBU
2.5000 mg | INHALATION_SOLUTION | Freq: Once | RESPIRATORY_TRACT | Status: AC
  Administered 2021-11-25: 2.5 mg via RESPIRATORY_TRACT

## 2021-12-12 ENCOUNTER — Other Ambulatory Visit: Payer: Self-pay

## 2021-12-12 ENCOUNTER — Encounter: Payer: Self-pay | Admitting: Pulmonary Disease

## 2021-12-12 ENCOUNTER — Ambulatory Visit: Payer: Medicare HMO | Admitting: Pulmonary Disease

## 2021-12-12 VITALS — BP 116/84 | HR 82 | Ht 64.0 in | Wt 150.0 lb

## 2021-12-12 DIAGNOSIS — R1310 Dysphagia, unspecified: Secondary | ICD-10-CM | POA: Diagnosis not present

## 2021-12-12 DIAGNOSIS — R942 Abnormal results of pulmonary function studies: Secondary | ICD-10-CM | POA: Diagnosis not present

## 2021-12-12 DIAGNOSIS — R053 Chronic cough: Secondary | ICD-10-CM | POA: Diagnosis not present

## 2021-12-12 DIAGNOSIS — R768 Other specified abnormal immunological findings in serum: Secondary | ICD-10-CM

## 2021-12-12 NOTE — Progress Notes (Signed)
Synopsis: Referred in December 2022 for COPD by Prince Solian, MD  Subjective:   PATIENT ID: [name] GENDER: female DOB: 15-Feb-1941, MRN: [id number]   HPI  Chief Complaint  Patient presents with   Consult    Referred by PCP for increased SOB and cough over the past 6 months. Non productive cough.    [name] is an 80 year old woman, never smoker with GERD and allergic rhinitis who is referred to pulmonary clinic for COPD evaluation.   She reports having dry cough over the last year with some shortness of breath and intermittent wheezing. She has been trialed on breztri inhaler by her PCP without notable improvement in her breathing.   She reports intermittent reflux symptoms and is unsure if she is taking 40 mg pantoprazole daily which is listed on her referral paperwork.  She does report trouble swallowing where she feels like solids and liquids get stuck in her throat.  She denies excessive coughing during mealtimes.  She denies any sinus or nasal congestion or postnasal drainage.  She does have seasonal allergies and is unable to report a specific season that affects her most.  She denies increased in cough, shortness of breath or wheezing when exposed to cold air, strong scents, cleaning agents or smoke.  She is a never smoker.  She does report significant secondhand smoke exposure when growing up from family members.  She had a evaluation by neurology in 09/2020 for concern of peripheral neuropathy.  At that time inflammatory labs were ordered which she had a positive ANA, unknown titer level along with positive ENA RNP antibody.  She reports diffuse joint pain over the past 2 to 3 years which includes her hands, knees, feet and legs.  She denies any skin rashes.  Pulmonary function test 11/25/2021 shows mild restrictive defect along with mild diffusion defect.  The most recent chest x-ray is 11/04/2019 which shows no active cardiopulmonary disease.  Past Medical  History:  Diagnosis Date   Allergic rhinitis    Arthritis    Cataract    Depression    GERD (gastroesophageal reflux disease)    Hemorrhoids    Hypothyroidism    Lichen planus    Migraine headache    Personal history of colonic adenoma 05/15/2013   Retinal wrinkling      Family History  Problem Relation Age of Onset   Coronary artery disease Father    Diabetes Sister    Coronary artery disease Mother    Coronary artery disease Brother    Colon cancer Brother    Diabetes Brother    Stomach cancer Neg Hx    Esophageal cancer Neg Hx    Pancreatic cancer Neg Hx    Liver disease Neg Hx      Social History   Socioeconomic History   Marital status: Married    Spouse name: Not on file   Number of children: Not on file   Years of education: Not on file   Highest education level: Not on file  Occupational History   Occupation: retired  Tobacco Use   Smoking status: Never   Smokeless tobacco: Never  Substance and Sexual Activity   Alcohol use: No   Drug use: No   Sexual activity: Never  Other Topics Concern   Not on file  Social History Narrative   Married 1988 # 2   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     Budeson-Glycopyrrol-Formoterol (BREZTRI AEROSPHERE) 160-9-4.8 MCG/ACT AERO Inhale 2 puffs into the lungs 2 (two) times daily.     Cholecalciferol (VITAMIN D3) 50 MCG (2000 UT) capsule Take 2,000 Units by mouth daily.     clotrimazole (MYCELEX) 10 MG  troche Take 10 mg by mouth as needed.     diclofenac Sodium (VOLTAREN) 1 % GEL Apply 4 g topically 3 (three) times daily as needed.     DULoxetine (CYMBALTA) 60 MG capsule 2 capsule     fluticasone (FLONASE) 50 MCG/ACT nasal spray fluticasone propionate 50 mcg/actuation nasal spray,suspension     levothyroxine (SYNTHROID, LEVOTHROID) 112 MCG tablet Take 56 mcg by mouth daily.      loratadine (CLARITIN) 10 MG tablet Take 10 mg by mouth daily.     pantoprazole (PROTONIX) 40 MG tablet Take 40 mg by mouth.     Probiotic Product (PROBIOTIC ADVANCED) CAPS Take by mouth daily.     SUMAtriptan (IMITREX) 100 MG tablet Take 1 tablet as needed for migraines     DULoxetine (CYMBALTA) 30 MG capsule Take 90 mg by mouth daily.     No facility-administered medications prior to visit.    Review of Systems  Constitutional:  Negative for chills, fever, malaise/fatigue and weight loss.  HENT:  Negative for congestion, sinus pain and sore throat.   Eyes: Negative.   Respiratory:  Positive for cough, shortness of breath and wheezing. Negative for hemoptysis and sputum production.   Cardiovascular:  Negative for chest pain, palpitations, orthopnea, claudication and leg swelling.  Gastrointestinal:  Negative for abdominal pain, heartburn, nausea and vomiting.  Genitourinary: Negative.   Musculoskeletal:  Positive for joint pain. Negative for myalgias.  Skin:  Negative for rash.  Neurological:  Negative for weakness.  Endo/Heme/Allergies: Negative.   Psychiatric/Behavioral:  Positive for memory loss.    Objective:   Vitals:   12/12/21 1614  BP: 116/84  Pulse: 82  SpO2: 98%  Weight: 150 lb (68 kg)  Height: 5\' 4"  (1.626 m)   Physical Exam Constitutional:      General: She is not in acute distress.    Appearance: She is not ill-appearing.  HENT:     Head: Normocephalic and atraumatic.  Eyes:     General: No scleral icterus.    Conjunctiva/sclera: Conjunctivae normal.     Pupils: Pupils are equal,  round, and reactive to light.  Cardiovascular:     Rate and Rhythm: Normal rate and regular rhythm.     Pulses: Normal pulses.     Heart sounds: Normal heart sounds. No murmur heard. Pulmonary:     Effort: Pulmonary effort is normal.     Breath sounds: Examination of the left-middle field reveals decreased breath sounds. Decreased breath sounds present. No wheezing, rhonchi or rales.  Abdominal:     General: Bowel sounds are normal.     Palpations: Abdomen is soft.  Musculoskeletal:     Right lower leg: No edema.     Left lower  leg: No edema.  Lymphadenopathy:     Cervical: No cervical adenopathy.  Skin:    General: Skin is warm and dry.  Neurological:     General: No focal deficit present.     Mental Status: She is alert and oriented to person, place, and time.     Comments: Abnormal memory  Psychiatric:        Mood and Affect: Mood normal.        Behavior: Behavior normal.        Thought Content: Thought content normal.        Judgment: Judgment normal.   CBC    Component Value Date/Time   WBC 5.1 11/05/2019 1350   RBC 4.34 11/05/2019 1350   HGB 13.6 11/05/2019 1350   HCT 42.2 11/05/2019 1350   PLT 233 11/05/2019 1350   MCV 97.2 11/05/2019 1350   MCH 31.3 11/05/2019 1350   MCHC 32.2 11/05/2019 1350   RDW 12.2 11/05/2019 1350   LYMPHSABS 3.4 04/05/2015 1440   MONOABS 0.5 04/05/2015 1440   EOSABS 0.3 04/05/2015 1440   BASOSABS 0.0 04/05/2015 1440   Chest imaging: CXR 11/04/19 The heart size and mediastinal contours are within normal limits. Both lungs are clear. The visualized skeletal structures are unremarkable.  PFT: PFT Results Latest Ref Rng & Units 11/07/2021  FVC-Pre L 1.88  FVC-Predicted Pre % 72  FVC-Post L 1.88  FVC-Predicted Post % 72  Pre FEV1/FVC % % 82  Post FEV1/FCV % % 72  FEV1-Pre L 1.55  FEV1-Predicted Pre % 79  FEV1-Post L 1.35  DLCO uncorrected ml/min/mmHg 15.34  DLCO UNC% % 81  DLVA Predicted % 110  TLC L 3.03  TLC % Predicted %  60  RV % Predicted % 44    Labs:  Path:  Echo:  Heart Catheterization:  Assessment & Plan:   Restrictive ventilatory defect - Plan: CT Chest High Resolution  Dysphagia, unspecified type - Plan: SLP modified barium swallow  ANA positive  Chronic cough  Discussion: [name] is an 80 year old woman, never smoker with GERD and allergic rhinitis who is referred to pulmonary clinic for COPD evaluation.   Patient has mild restrictive defect and mild diffusion defect based on her recent pulmonary function testing.  The etiology of these defects is unknown at this time.  She does have history concerning for dysphagia and GERD which we will evaluate further with a modified barium swallow.  She also has recent history of diffuse joint pain and positive ANA concerning for inflammatory lung disease.  We will check a high-resolution CT chest scan for further evaluation of her lung parenchyma and airways.  If the CT chest scan is abnormal then the patient has agreed to further serologic testing for inflammatory work-up.  She is to continue Breztri inhaler 2 puffs twice daily.  We will also need to confirm in the future whether she is taking PPI therapy for her history of reflux.  Follow-up in 2 months.  Freda Jackson, MD [facility] Office: [phone]   Current Outpatient Medications:    Biotin 800 MCG TABS, Take 800 tablets by mouth daily., Disp: , Rfl:    Budeson-Glycopyrrol-Formoterol (BREZTRI AEROSPHERE) 160-9-4.8 MCG/ACT AERO, Inhale 2 puffs into the lungs 2 (two) times daily., Disp: , Rfl:    Cholecalciferol (VITAMIN D3) 50 MCG (2000 UT) capsule, Take 2,000 Units by mouth daily., Disp: , Rfl:    clotrimazole (MYCELEX) 10 MG troche, Take 10 mg by mouth as  needed., Disp: , Rfl:    diclofenac Sodium (VOLTAREN) 1 % GEL, Apply 4 g topically 3 (three) times daily as needed., Disp: , Rfl:    DULoxetine (CYMBALTA) 60 MG capsule, 2 capsule, Disp: , Rfl:     fluticasone (FLONASE) 50 MCG/ACT nasal spray, fluticasone propionate 50 mcg/actuation nasal spray,suspension, Disp: , Rfl:    levothyroxine (SYNTHROID, LEVOTHROID) 112 MCG tablet, Take 56 mcg by mouth daily. , Disp: , Rfl:    loratadine (CLARITIN) 10 MG tablet, Take 10 mg by mouth daily., Disp: , Rfl:    pantoprazole (PROTONIX) 40 MG tablet, Take 40 mg by mouth., Disp: , Rfl:    Probiotic Product (PROBIOTIC ADVANCED) CAPS, Take by mouth daily., Disp: , Rfl:    SUMAtriptan (IMITREX) 100 MG tablet, Take 1 tablet as needed for migraines, Disp: , Rfl:

## 2021-12-12 NOTE — Patient Instructions (Addendum)
Your cough is possibly from underlying inflammatory lung disease or ongoing reflux disease or from your trouble swallowing.   We will check a modified barium swallow to evaluate your trouble swallowing.   We will check a CT Chest scan to further evaluate your restrictive defect on pulmonary function tests.   If your CT chest scan is abnormal for inflammatory lung disease then we will check lab tests.

## 2021-12-13 ENCOUNTER — Other Ambulatory Visit (HOSPITAL_COMMUNITY): Payer: Self-pay

## 2021-12-13 DIAGNOSIS — R059 Cough, unspecified: Secondary | ICD-10-CM

## 2021-12-13 DIAGNOSIS — R1310 Dysphagia, unspecified: Secondary | ICD-10-CM

## 2021-12-29 ENCOUNTER — Ambulatory Visit (HOSPITAL_COMMUNITY): Payer: Medicare HMO

## 2021-12-29 ENCOUNTER — Encounter (HOSPITAL_COMMUNITY): Payer: Medicare HMO

## 2022-01-03 ENCOUNTER — Telehealth (HOSPITAL_COMMUNITY): Payer: Self-pay

## 2022-01-03 NOTE — Telephone Encounter (Signed)
Attempted to call patient to reschedule OP MBS - someone else answered and stated she is not home right now. Patient will return call.

## 2022-01-04 ENCOUNTER — Other Ambulatory Visit: Payer: Medicare HMO

## 2022-01-11 ENCOUNTER — Ambulatory Visit (HOSPITAL_COMMUNITY)
Admission: RE | Admit: 2022-01-11 | Discharge: 2022-01-11 | Disposition: A | Discharge status: Home / Self Care | Payer: Medicare HMO | Source: Ambulatory Visit | Attending: Pulmonary Disease | Admitting: Pulmonary Disease

## 2022-01-11 ENCOUNTER — Other Ambulatory Visit: Payer: Self-pay

## 2022-01-11 DIAGNOSIS — R1310 Dysphagia, unspecified: Secondary | ICD-10-CM

## 2022-01-11 DIAGNOSIS — R1314 Dysphagia, pharyngoesophageal phase: Secondary | ICD-10-CM | POA: Insufficient documentation

## 2022-01-11 DIAGNOSIS — R059 Cough, unspecified: Secondary | ICD-10-CM | POA: Diagnosis not present

## 2022-01-11 NOTE — Therapy (Signed)
Modified Barium Swallow Progress Note  Patient Details  Name: [name] MRN: [id number] Date of Birth: Jan 01, 1941  Today's Date: 01/11/2022  Modified Barium Swallow completed.  Full report located under Chart Review in the Imaging Section.  Brief recommendations include the following:  Clinical Impression  Pt presents with functional oral and pharyngeal swallow. No penetration or aspiration seen on any consistency. No post-swallow residue noted. Cricopharyngeal relaxation appeared to be reduced, however, esophageal sweep did not reveal stasis or backflow. Nothing visualized to cause globus sensation. Recommend consideration of an esophageal work up if pt's globus sensation persists or worsens. Pt was encouraged to limit bolus size and rate, and to minimize distractions at meal time. No further ST intervention recommended at this time. Please reconsult if needs arise.     Swallow Evaluation Recommendations  Recommended Consults: Consider esophageal assessment;Consider GI evaluation   SLP Diet Recommendations: Regular solids;Thin liquid   Liquid Administration via: Straw;Cup   Medication Administration: Whole meds with liquid   Supervision: Patient able to self feed   Compensations: Minimize environmental distractions;Slow rate;Small sips/bites   Postural Changes: Seated upright at 90 degrees;Remain semi-upright after after feeds/meals   Oral Care Recommendations: Oral care BID     [name] B. Quentin Ore, [facility], [place] Speech Language Pathologist Office: [phone]  Shonna Chock 01/11/2022,1:39 PM

## 2022-01-23 ENCOUNTER — Telehealth: Payer: Self-pay

## 2022-01-23 NOTE — Telephone Encounter (Signed)
Yes, CT Chest is still necessary to evaluate her abnormal pulmonary function tests.  Thanks, JD

## 2022-01-23 NOTE — Telephone Encounter (Signed)
Called and spoke with patient. She verbalized understanding of results. She wanted to know that since the swallow test did not show any abnormalities, does she still need to have the CT scan that is scheduled for this Wednesday.   JD, can you please advise? Thanks!

## 2022-01-23 NOTE — Telephone Encounter (Signed)
-----   Message from Freddi Starr, MD sent at 01/15/2022  3:53 PM EST ----- Regarding: Modified Barium Swallow results Please let patient know the following results:   Pt presents with functional oral and pharyngeal swallow. No penetration or aspiration seen on any consistency. No post-swallow residue noted. Cricopharyngeal relaxation appeared to be reduced, however, esophageal sweep did not reveal stasis or backflow. Nothing visualized to cause pt's globus sensation. Recommend consideration of an esophageal work up if pt's globus sensation persists or worsens. Pt was encouraged to limit bolus size and rate, and to minimize distractions at meal time. No further ST intervention recommended at this time. Please reconsult if needs arise.  I see she has seen Dr. Carlean Purl in the past from GI. I would recommend that she be evaluated by him for further assessment of her esophagus leading to reflux, aspiration and her chronic cough.  Thanks, JD  ----- Message ----- From: Rodney Booze Sent: 01/11/2022   1:38 PM EST To: Freddi Starr, MD  MBS results. Thank you for this referral!  Celia B. Quentin Ore, [place], [place] Speech Language Pathologist Office: [phone]

## 2022-01-24 NOTE — Telephone Encounter (Signed)
Called and spoke with patient. She verbalized understanding and will have the CT scan tomorrow.   Nothing further needed at time of call.

## 2022-01-25 ENCOUNTER — Ambulatory Visit (INDEPENDENT_AMBULATORY_CARE_PROVIDER_SITE_OTHER)
Admission: RE | Admit: 2022-01-25 | Discharge: 2022-01-25 | Disposition: A | Discharge status: Home / Self Care | Payer: Medicare HMO | Source: Ambulatory Visit | Attending: Pulmonary Disease | Admitting: Pulmonary Disease

## 2022-01-25 ENCOUNTER — Other Ambulatory Visit: Payer: Self-pay

## 2022-01-25 DIAGNOSIS — J84112 Idiopathic pulmonary fibrosis: Secondary | ICD-10-CM | POA: Diagnosis not present

## 2022-01-25 DIAGNOSIS — R0600 Dyspnea, unspecified: Secondary | ICD-10-CM | POA: Diagnosis not present

## 2022-01-25 DIAGNOSIS — I2510 Atherosclerotic heart disease of native coronary artery without angina pectoris: Secondary | ICD-10-CM | POA: Diagnosis not present

## 2022-01-25 DIAGNOSIS — R942 Abnormal results of pulmonary function studies: Secondary | ICD-10-CM | POA: Diagnosis not present

## 2022-01-25 DIAGNOSIS — J9811 Atelectasis: Secondary | ICD-10-CM | POA: Diagnosis not present

## 2022-01-26 ENCOUNTER — Encounter: Payer: Self-pay | Admitting: Pulmonary Disease

## 2022-01-27 DIAGNOSIS — Z1231 Encounter for screening mammogram for malignant neoplasm of breast: Secondary | ICD-10-CM | POA: Diagnosis not present

## 2022-01-27 NOTE — Telephone Encounter (Signed)
Dr. Erin Fulling, please see mychart message sent by pt and advise of results of HRCT.

## 2022-02-27 ENCOUNTER — Other Ambulatory Visit: Payer: Self-pay

## 2022-02-27 ENCOUNTER — Ambulatory Visit: Payer: Medicare HMO | Admitting: Podiatry

## 2022-02-27 DIAGNOSIS — L989 Disorder of the skin and subcutaneous tissue, unspecified: Secondary | ICD-10-CM | POA: Diagnosis not present

## 2022-02-27 DIAGNOSIS — M2041 Other hammer toe(s) (acquired), right foot: Secondary | ICD-10-CM | POA: Diagnosis not present

## 2022-02-27 DIAGNOSIS — B351 Tinea unguium: Secondary | ICD-10-CM | POA: Diagnosis not present

## 2022-02-27 DIAGNOSIS — M2042 Other hammer toe(s) (acquired), left foot: Secondary | ICD-10-CM

## 2022-03-08 DIAGNOSIS — H532 Diplopia: Secondary | ICD-10-CM | POA: Diagnosis not present

## 2022-03-08 DIAGNOSIS — H5213 Myopia, bilateral: Secondary | ICD-10-CM | POA: Diagnosis not present

## 2022-03-08 DIAGNOSIS — Z961 Presence of intraocular lens: Secondary | ICD-10-CM | POA: Diagnosis not present

## 2022-03-08 DIAGNOSIS — H35373 Puckering of macula, bilateral: Secondary | ICD-10-CM | POA: Diagnosis not present

## 2022-03-10 NOTE — Progress Notes (Signed)
? ?  Subjective: 81 year old female presenting today for evaluation of symptomatic calluses that continue to recur to the bilateral feet as well as some discoloration and thickening to her toenails.  She also has a longstanding history of hammertoe deformity to the bilateral feet.  She presents for further treatment and evaluation ? ? ?Past Medical History:  ?Diagnosis Date  ? Allergic rhinitis   ? Arthritis   ? Cataract   ? Depression   ? GERD (gastroesophageal reflux disease)   ? Hemorrhoids   ? Hypothyroidism   ? Lichen planus   ? Migraine headache   ? Personal history of colonic adenoma 05/15/2013  ? Retinal wrinkling   ? ? ?Objective:  ?Physical Exam ?General: Alert and oriented x3 in no acute distress ? ?Dermatology: Hyperkeratotic lesion present on the weightbearing surfaces the bilateral feet ?2. Pain on palpation with a central nucleated core noted.  Skin is warm, dry and supple bilateral lower extremities. Negative for open lesions or macerations.  There is also some discoloration with thickening to the left hallux nail plate.  Findings consistent with onychomycosis of the toenail ? ?Vascular: Palpable pedal pulses bilaterally. No edema or erythema noted. Capillary refill within normal limits. ? ?Neurological: Epicritic and protective threshold grossly intact bilaterally.  ? ?Musculoskeletal Exam: Pain on palpation at the keratotic lesion noted. Hammertoe contracture deformity noted to the 2nd digit of the bilateral feet. Range of motion within normal limits bilateral. Muscle strength 5/5 in all groups bilateral. ? ?Assessment: ?#1 pre-ulcerative callous lesions bilateral great toes ? 2 ?#2 hammertoe contracture 2nd digit bilateral  ?#3 fungal nail infection left hallux ? ? ?Plan of Care:  ?#1 Patient evaluated ?#2 Excisional debridement of keratoic lesion using a chisel blade was performed without incident.  ?#3 continue conservative care of the hammertoe deformities including good supportive shoes that do  not constrict the toebox area ?#4 Tolcylen antifungal topical was dispensed at checkout for the discoloration and fungal nail infection ?#5 Return to clinic as needed.  ? ? ?Edrick Kins, DPM ?[place] ? ?Dr. Edrick Kins, DPM  ?  ?[address]                                       ?[place], [place] [address]                ?Office [phone]  ?Fax [phone] ? ? ? ? ? ?

## 2022-03-15 DIAGNOSIS — D1801 Hemangioma of skin and subcutaneous tissue: Secondary | ICD-10-CM | POA: Diagnosis not present

## 2022-03-15 DIAGNOSIS — D225 Melanocytic nevi of trunk: Secondary | ICD-10-CM | POA: Diagnosis not present

## 2022-03-15 DIAGNOSIS — L813 Cafe au lait spots: Secondary | ICD-10-CM | POA: Diagnosis not present

## 2022-03-15 DIAGNOSIS — D2261 Melanocytic nevi of right upper limb, including shoulder: Secondary | ICD-10-CM | POA: Diagnosis not present

## 2022-03-15 DIAGNOSIS — L821 Other seborrheic keratosis: Secondary | ICD-10-CM | POA: Diagnosis not present

## 2022-03-15 DIAGNOSIS — L814 Other melanin hyperpigmentation: Secondary | ICD-10-CM | POA: Diagnosis not present

## 2022-04-21 DIAGNOSIS — E785 Hyperlipidemia, unspecified: Secondary | ICD-10-CM | POA: Diagnosis not present

## 2022-04-21 DIAGNOSIS — Z79899 Other long term (current) drug therapy: Secondary | ICD-10-CM | POA: Diagnosis not present

## 2022-04-21 DIAGNOSIS — E039 Hypothyroidism, unspecified: Secondary | ICD-10-CM | POA: Diagnosis not present

## 2022-04-21 DIAGNOSIS — M859 Disorder of bone density and structure, unspecified: Secondary | ICD-10-CM | POA: Diagnosis not present

## 2022-04-24 DIAGNOSIS — Z0000 Encounter for general adult medical examination without abnormal findings: Secondary | ICD-10-CM | POA: Diagnosis not present

## 2022-04-24 DIAGNOSIS — E039 Hypothyroidism, unspecified: Secondary | ICD-10-CM | POA: Diagnosis not present

## 2022-04-24 DIAGNOSIS — K219 Gastro-esophageal reflux disease without esophagitis: Secondary | ICD-10-CM | POA: Diagnosis not present

## 2022-04-24 DIAGNOSIS — Z8249 Family history of ischemic heart disease and other diseases of the circulatory system: Secondary | ICD-10-CM | POA: Diagnosis not present

## 2022-04-24 DIAGNOSIS — F39 Unspecified mood [affective] disorder: Secondary | ICD-10-CM | POA: Diagnosis not present

## 2022-04-24 DIAGNOSIS — R030 Elevated blood-pressure reading, without diagnosis of hypertension: Secondary | ICD-10-CM | POA: Diagnosis not present

## 2022-04-24 DIAGNOSIS — E785 Hyperlipidemia, unspecified: Secondary | ICD-10-CM | POA: Diagnosis not present

## 2022-04-24 DIAGNOSIS — G629 Polyneuropathy, unspecified: Secondary | ICD-10-CM | POA: Diagnosis not present

## 2022-04-24 DIAGNOSIS — R82998 Other abnormal findings in urine: Secondary | ICD-10-CM | POA: Diagnosis not present

## 2022-04-24 DIAGNOSIS — Z23 Encounter for immunization: Secondary | ICD-10-CM | POA: Diagnosis not present

## 2022-04-24 DIAGNOSIS — J441 Chronic obstructive pulmonary disease with (acute) exacerbation: Secondary | ICD-10-CM | POA: Diagnosis not present

## 2022-08-08 DIAGNOSIS — H903 Sensorineural hearing loss, bilateral: Secondary | ICD-10-CM | POA: Diagnosis not present

## 2022-08-08 DIAGNOSIS — H6121 Impacted cerumen, right ear: Secondary | ICD-10-CM | POA: Diagnosis not present

## 2022-08-16 DIAGNOSIS — H6121 Impacted cerumen, right ear: Secondary | ICD-10-CM | POA: Diagnosis not present

## 2022-08-16 DIAGNOSIS — H903 Sensorineural hearing loss, bilateral: Secondary | ICD-10-CM | POA: Diagnosis not present

## 2022-09-06 DIAGNOSIS — H6123 Impacted cerumen, bilateral: Secondary | ICD-10-CM | POA: Diagnosis not present

## 2022-09-06 DIAGNOSIS — H7191 Unspecified cholesteatoma, right ear: Secondary | ICD-10-CM | POA: Diagnosis not present

## 2022-12-08 ENCOUNTER — Other Ambulatory Visit: Payer: Self-pay | Admitting: Otolaryngology

## 2022-12-08 DIAGNOSIS — H9041 Sensorineural hearing loss, unilateral, right ear, with unrestricted hearing on the contralateral side: Secondary | ICD-10-CM

## 2022-12-08 DIAGNOSIS — H906 Mixed conductive and sensorineural hearing loss, bilateral: Secondary | ICD-10-CM | POA: Diagnosis not present

## 2023-01-09 ENCOUNTER — Encounter: Payer: Self-pay | Admitting: Otolaryngology

## 2023-01-16 DIAGNOSIS — M546 Pain in thoracic spine: Secondary | ICD-10-CM | POA: Diagnosis not present

## 2023-01-16 DIAGNOSIS — R42 Dizziness and giddiness: Secondary | ICD-10-CM | POA: Diagnosis not present

## 2023-01-16 DIAGNOSIS — G629 Polyneuropathy, unspecified: Secondary | ICD-10-CM | POA: Diagnosis not present

## 2023-01-16 DIAGNOSIS — R0781 Pleurodynia: Secondary | ICD-10-CM | POA: Diagnosis not present

## 2023-01-16 DIAGNOSIS — R296 Repeated falls: Secondary | ICD-10-CM | POA: Diagnosis not present

## 2023-01-18 ENCOUNTER — Other Ambulatory Visit: Payer: Self-pay

## 2023-01-18 ENCOUNTER — Encounter: Payer: Self-pay | Admitting: Emergency Medicine

## 2023-01-18 ENCOUNTER — Ambulatory Visit
Admission: EM | Admit: 2023-01-18 | Discharge: 2023-01-18 | Disposition: A | Discharge status: Home / Self Care | Payer: Medicare HMO

## 2023-01-18 ENCOUNTER — Emergency Department (HOSPITAL_COMMUNITY)
Admission: EM | Admit: 2023-01-18 | Discharge: 2023-01-18 | Disposition: A | Discharge status: Home / Self Care | Payer: Medicare HMO | Attending: Emergency Medicine | Admitting: Emergency Medicine

## 2023-01-18 ENCOUNTER — Emergency Department (HOSPITAL_COMMUNITY): Payer: Medicare HMO

## 2023-01-18 DIAGNOSIS — K5730 Diverticulosis of large intestine without perforation or abscess without bleeding: Secondary | ICD-10-CM | POA: Diagnosis not present

## 2023-01-18 DIAGNOSIS — K5900 Constipation, unspecified: Secondary | ICD-10-CM | POA: Diagnosis not present

## 2023-01-18 DIAGNOSIS — R55 Syncope and collapse: Secondary | ICD-10-CM | POA: Diagnosis not present

## 2023-01-18 DIAGNOSIS — M549 Dorsalgia, unspecified: Secondary | ICD-10-CM | POA: Diagnosis not present

## 2023-01-18 DIAGNOSIS — R1011 Right upper quadrant pain: Secondary | ICD-10-CM | POA: Diagnosis not present

## 2023-01-18 DIAGNOSIS — R1031 Right lower quadrant pain: Secondary | ICD-10-CM | POA: Insufficient documentation

## 2023-01-18 DIAGNOSIS — R42 Dizziness and giddiness: Secondary | ICD-10-CM | POA: Diagnosis not present

## 2023-01-18 LAB — URINALYSIS, ROUTINE W REFLEX MICROSCOPIC
Bilirubin Urine: NEGATIVE
Glucose, UA: NEGATIVE mg/dL
Ketones, ur: NEGATIVE mg/dL
Leukocytes,Ua: NEGATIVE
Nitrite: NEGATIVE
Protein, ur: NEGATIVE mg/dL
Specific Gravity, Urine: 1.01 (ref 1.005–1.030)
pH: 5 (ref 5.0–8.0)

## 2023-01-18 LAB — COMPREHENSIVE METABOLIC PANEL
ALT: 13 U/L (ref 0–44)
AST: 27 U/L (ref 15–41)
Albumin: 4.1 g/dL (ref 3.5–5.0)
Alkaline Phosphatase: 74 U/L (ref 38–126)
Anion gap: 5 (ref 5–15)
BUN: 13 mg/dL (ref 8–23)
CO2: 28 mmol/L (ref 22–32)
Calcium: 9.1 mg/dL (ref 8.9–10.3)
Chloride: 103 mmol/L (ref 98–111)
Creatinine, Ser: 0.87 mg/dL (ref 0.44–1.00)
GFR, Estimated: >60 mL/min (ref >60)
Glucose, Bld: 110 mg/dL — ABNORMAL HIGH (ref 70–99)
Potassium: 3.5 mmol/L (ref 3.5–5.1)
Sodium: 136 mmol/L (ref 135–145)
Total Bilirubin: 0.8 mg/dL (ref 0.3–1.2)
Total Protein: 7.4 g/dL (ref 6.5–8.1)

## 2023-01-18 LAB — CBC WITH DIFFERENTIAL/PLATELET
Abs Immature Granulocytes: 0.01 10*3/uL (ref 0.00–0.07)
Basophils Absolute: 0 10*3/uL (ref 0.0–0.1)
Basophils Relative: 1 %
Eosinophils Absolute: 0.3 10*3/uL (ref 0.0–0.5)
Eosinophils Relative: 4 %
HCT: 41.7 % (ref 36.0–46.0)
Hemoglobin: 13.3 g/dL (ref 12.0–15.0)
Immature Granulocytes: 0 %
Lymphocytes Relative: 30 %
Lymphs Abs: 2.2 10*3/uL (ref 0.7–4.0)
MCH: 30.6 pg (ref 26.0–34.0)
MCHC: 31.9 g/dL (ref 30.0–36.0)
MCV: 96.1 fL (ref 80.0–100.0)
Monocytes Absolute: 0.6 10*3/uL (ref 0.1–1.0)
Monocytes Relative: 8 %
Neutro Abs: 4.1 10*3/uL (ref 1.7–7.7)
Neutrophils Relative %: 57 %
Platelets: 230 10*3/uL (ref 150–400)
RBC: 4.34 MIL/uL (ref 3.87–5.11)
RDW: 12.4 % (ref 11.5–15.5)
WBC: 7.2 10*3/uL (ref 4.0–10.5)
nRBC: 0 % (ref 0.0–0.2)

## 2023-01-18 LAB — URINALYSIS, MICROSCOPIC (REFLEX)

## 2023-01-18 LAB — TROPONIN I (HIGH SENSITIVITY)
Troponin I (High Sensitivity): 2 ng/L (ref <18)
Troponin I (High Sensitivity): 4 ng/L (ref <18)

## 2023-01-18 LAB — LIPASE, BLOOD: Lipase: 35 U/L (ref 11–51)

## 2023-01-18 MED ORDER — IOHEXOL 300 MG/ML  SOLN
100.0000 mL | Freq: Once | INTRAMUSCULAR | Status: AC | PRN
  Administered 2023-01-18: 100 mL via INTRAVENOUS

## 2023-01-18 MED ORDER — HYDROMORPHONE HCL 1 MG/ML IJ SOLN
1.0000 mg | Freq: Once | INTRAMUSCULAR | Status: AC
  Administered 2023-01-18: 1 mg via INTRAVENOUS
  Filled 2023-01-18: qty 1

## 2023-01-18 MED ORDER — ONDANSETRON HCL 4 MG/2ML IJ SOLN
4.0000 mg | Freq: Once | INTRAMUSCULAR | Status: AC
  Administered 2023-01-18: 4 mg via INTRAVENOUS
  Filled 2023-01-18: qty 2

## 2023-01-18 NOTE — ED Provider Notes (Signed)
Patient presents to UC for flank pain but upon discussion admits to multiple episodes of blacking out and falling that she cannot recall- recommended further evaluation in the ED for stat labs and imaging. Patient is agreeable to same. Husband who is with patient will transport via [place].   Francene Finders, PA-C 01/18/23 1028

## 2023-01-18 NOTE — ED Triage Notes (Signed)
Right side/flank pain starting 5 days ago. Pain comes and goes, changing positions helps, never had this happen before, denies any urinary changes/fever. Describes as sharp. Extra strength tylenol not helping pain. States she passed out for no known reason on Saturday night, "I just blanked out, I've done that several times lately, and a I just hit the floor." Went to her PCP and states they gave her a link to go to online that she couldn't access. States eating does not change the pain. Denies SOB, palpitations

## 2023-01-18 NOTE — ED Provider Triage Note (Signed)
Emergency Medicine Provider Triage Evaluation Note  [name] , a 82 y.o. female  was evaluated in triage.  Pt complains of syncopal episode and worsening abdominal pain.  Patient states 5 days ago she had a 1-2 syncopal episodes, reports associated falls, unsure whether hit head, denies head pain or vision changes.  Since then has been feeling significant right upper quadrant pain, localized, sharp, intermittent.  Denies lower extremity weakness or pain radiating through to the back.  Not on anticoagulation.  Hx prior cholecystectomy greater than 1 year ago.  Review of Systems  Positive:  Negative: See above  Physical Exam  BP 126/89 (BP Location: Left Arm)   Pulse 73   Temp 98.6 F (37 C) (Oral)   Resp 16   Ht '5\' 4"'$  (1.626 m)   Wt 68 kg   SpO2 100%   BMI 25.73 kg/m  Gen:   Awake, no distress , uncomfortable appearing Resp:  Normal effort  MSK:   Moves extremities without difficulty  Other:  RUQ tenderness, soft, protuberant.  Strong PT and DP pulses bilaterally 2+.  BLE appear grossly neurovascularly intact.  Pt oriented to self and place, appears somewhat disoriented to event.  Gaze aligned appropriately.  Chest nonTTP.  Medical Decision Making  Medically screening exam initiated at 12:19 PM.  Appropriate orders placed.  [name] was informed that the remainder of the evaluation will be completed by another provider, this initial triage assessment does not replace that evaluation, and the importance of remaining in the ED until their evaluation is complete.  Labs, imaging ordered.  Discussed with triage nurse, plan to place IV.   Prince Rome, PA-C 15/72/62 1229

## 2023-01-18 NOTE — Discharge Instructions (Signed)
It was a pleasure meeting you.  He came in complaining of some abdominal pain, it seems like you have a lot of stool in your stomach.  I want you to keep taking the MiraLAX, when you get home take 2 of them.  Was also found that you have a cyst on your ovary, we are sending a referral to an OB/GYN doctor, please give them a call as soon as you can to schedule an appointment.

## 2023-01-18 NOTE — ED Notes (Signed)
Labeled urine specimen and culture sent to lab. ENMiles 

## 2023-01-18 NOTE — ED Provider Notes (Signed)
[facility] Provider Note   CSN: [id number] Arrival date & time: 01/18/23  1052     History  Chief Complaint  Patient presents with   Flank Pain    [name] is a 82 y.o. female with past medical history of sensorineural hearing loss bilaterally and neuropathy. Pt fell on Saturday night when getting up to use the restroom and landed on her right side, since then she has had significant RLQ abdominal pain that radiates to her back. She describes the pain as sharp, and stabbing in nature. The pain is intermittent, and she denies any exacerbating and alleviating factors. She has tried tylenol and ibuprofen with no relief.   Of note, patient has had multiple falls within the last year, and she had three in December alone. She takes Cymbalta daily for neuropathic pain relief. She always feels dizzy before her falls, however has not lost consciousness.   Pt also has a history of constipation, for which she uses miralax twice a day. She states she has been dealing with constipation all of her life, and her last bowel movement was six days ago.    Flank Pain       Home Medications Prior to Admission medications   Medication Sig Start Date End Date Taking? Authorizing Provider  Biotin 800 MCG TABS Take 800 tablets by mouth daily.    [provider]  Budeson-Glycopyrrol-Formoterol (BREZTRI AEROSPHERE) 160-9-4.8 MCG/ACT AERO Inhale 2 puffs into the lungs 2 (two) times daily.    [provider]  Cholecalciferol (VITAMIN D3) 50 MCG (2000 UT) capsule Take 2,000 Units by mouth daily.    [provider]  clotrimazole (MYCELEX) 10 MG troche Take 10 mg by mouth as needed.    [provider]  diclofenac Sodium (VOLTAREN) 1 % GEL Apply 4 g topically 3 (three) times daily as needed.    [provider]  DULoxetine (CYMBALTA) 60 MG capsule 2 capsule    [provider]  fluticasone (FLONASE) 50  MCG/ACT nasal spray fluticasone propionate 50 mcg/actuation nasal spray,suspension    [provider]  levothyroxine (SYNTHROID, LEVOTHROID) 112 MCG tablet Take 56 mcg by mouth daily.     [provider]  loratadine (CLARITIN) 10 MG tablet Take 10 mg by mouth daily. 11/07/19   [provider]  pantoprazole (PROTONIX) 40 MG tablet Take 40 mg by mouth.    [provider]  Probiotic Product (PROBIOTIC ADVANCED) CAPS Take by mouth daily.    [provider]  SUMAtriptan (IMITREX) 100 MG tablet Take 1 tablet as needed for migraines 12/04/17   [provider]      Allergies    Clindamycin and Levofloxacin    Review of Systems   Review of Systems  Genitourinary:  Positive for flank pain.    Physical Exam Updated Vital Signs BP 130/72   Pulse (!) 101   Temp 98.1 F (36.7 C) (Oral)   Resp 14   Ht '5\' 4"'$  (1.626 m)   Wt 68 kg   SpO2 96%   BMI 25.73 kg/m  Physical Exam Constitutional:      General: She is in acute distress.     Comments: Uncomfortable appearing woman  Abdominal:     General: Bowel sounds are normal.     Tenderness: There is abdominal tenderness. There is guarding. There is no right CVA tenderness or left CVA tenderness.     Comments: Very tender to palpation in RLQ  and RUQ, mild tenderness in her back on the right side.   Neurological:     General: No focal deficit present.     Mental Status: She is alert and oriented to person, place, and time.     Cranial Nerves: No cranial nerve deficit.     Motor: No weakness.     Gait: Gait normal.     ED Results / Procedures / Treatments   Labs (all labs ordered are listed, but only abnormal results are displayed) Labs Reviewed  COMPREHENSIVE METABOLIC PANEL - Abnormal; Notable for the following components:      Result Value   Glucose, Bld 110 (*)    All other components within normal limits  URINALYSIS, ROUTINE W REFLEX MICROSCOPIC - Abnormal; Notable for the  following components:   Hgb urine dipstick TRACE (*)    All other components within normal limits  URINALYSIS, MICROSCOPIC (REFLEX) - Abnormal; Notable for the following components:   Bacteria, UA FEW (*)    All other components within normal limits  CBC WITH DIFFERENTIAL/PLATELET  LIPASE, BLOOD  TROPONIN I (HIGH SENSITIVITY)  TROPONIN I (HIGH SENSITIVITY)    EKG EKG Interpretation  Date/Time:  Thursday January 18 2023 12:32:31 EST Ventricular Rate:  74 PR Interval:  144 QRS Duration: 84 QT Interval:  397 QTC Calculation: 441 R Axis:   19 Text Interpretation: Sinus rhythm Low voltage, precordial leads Consider anterior infarct Confirmed by Campbell Stall ([id number]) on 08/25/5175 12:38:39 PM  Radiology CT Abdomen Pelvis W Contrast  Result Date: 01/18/2023 CLINICAL DATA:  Abdominal pain, acute, nonlocalized. Right-sided flank pain x5 days. EXAM: CT ABDOMEN AND PELVIS WITH CONTRAST TECHNIQUE: Multidetector CT imaging of the abdomen and pelvis was performed using the standard protocol following bolus administration of intravenous contrast. RADIATION DOSE REDUCTION: This exam was performed according to the departmental dose-optimization program which includes automated exposure control, adjustment of the mA and/or kV according to patient size and/or use of iterative reconstruction technique. CONTRAST:  154m OMNIPAQUE IOHEXOL 300 MG/ML  SOLN COMPARISON:  CT abdomen/pelvis 05/01/2013. FINDINGS: Lower chest: Linear atelectasis in the lung bases. Trace right pleural effusion. Normal heart size. No pericardial effusion. Hepatobiliary: Unchanged 1.0 cm hypoenhancing lesion in liver segment 2, likely hemangioma. Focal fat along the falciform ligament. Prior cholecystectomy. No biliary dilatation. Pancreas: Unremarkable. No pancreatic ductal dilatation or surrounding inflammatory changes. Spleen: Normal. Adrenals/Urinary Tract: Normal adrenals. Kidneys are unremarkable. No calculi or hydronephrosis.  Underdistended bladder. Stomach/Bowel: Normal stomach and duodenum. No dilated loops of small bowel. Normal appendix is visualized on image 67 series 3. Large burden of stool in the right colon and hepatic flexure. Redundant transverse colon. Decompressed sigmoid colon and rectum. Mild sigmoid diverticulosis without evidence of acute diverticulitis. Vascular/Lymphatic: Atherosclerotic calcifications of the abdominal aorta and its branches. Mild mesenteric lymphadenopathy in the left lower quadrant with surrounding fat stranding and the dominant node measuring up to 17 x 14 mm on image 51 of series 3. Reproductive: Cystic right adnexal lesion with single thin septation, measuring up to 3.6 x 2.1 cm. Other: No abdominal wall hernia or abnormality. No abdominopelvic ascites. Musculoskeletal: Right-greater-than-left hip degeneration. Lumbar spondylosis without high-grade spinal canal stenosis. No suspicious bone lesions. IMPRESSION: 1. Large burden of stool in the right colon and hepatic flexure. Correlate for constipation. 2. Mild mesenteric lymphadenopathy in the left lower quadrant with surrounding fat stranding. Findings are nonspecific but can be seen in the setting of mesenteric adenitis. 3. Cystic right adnexal lesion with single thin septation, measuring up  to 3.6 cm. Recommend further characterization with pelvic ultrasound. Reference: JACR 2020 Feb; 17(2):248-254 Aortic Atherosclerosis (ICD10-I70.0). Electronically Signed   By: Emmit Alexanders M.D.   On: 01/18/2023 14:56   CT Head Wo Contrast  Result Date: 01/18/2023 CLINICAL DATA:  Provided history: Head trauma, minor. Recent syncopal episode. EXAM: CT HEAD WITHOUT CONTRAST TECHNIQUE: Contiguous axial images were obtained from the base of the skull through the vertex without intravenous contrast. RADIATION DOSE REDUCTION: This exam was performed according to the departmental dose-optimization program which includes automated exposure control, adjustment  of the mA and/or kV according to patient size and/or use of iterative reconstruction technique. COMPARISON:  No pertinent prior exams available for comparison. FINDINGS: Brain: Cerebral atrophy which is somewhat central predominant. Commensurate prominence of the ventricles and sulci. Mild patchy and ill-defined hypoattenuation within the cerebral white matter, nonspecific but compatible with chronic small vessel ischemic disease. There is no acute intracranial hemorrhage. No demarcated cortical infarct. No extra-axial fluid collection. No evidence of an intracranial mass. No midline shift. Vascular: No hyperdense vessel. Atherosclerotic calcifications. Skull: No fracture or aggressive osseous lesion. Sinuses/Orbits: No mass or acute finding within the imaged orbits. Mild mucosal thickening within the bilateral frontal, ethmoid and sphenoid sinuses at the imaged levels. IMPRESSION: 1. No evidence of acute intracranial abnormality. 2. Parenchymal atrophy and chronic small vessel ischemic disease. 3. Mild paranasal sinus mucosal thickening. Electronically Signed   By: Kellie Simmering D.O.   On: 01/18/2023 14:50     Medications Ordered in ED Medications  iohexol (OMNIPAQUE) 300 MG/ML solution 100 mL (100 mLs Intravenous Contrast Given 01/18/23 1429)  HYDROmorphone (DILAUDID) injection 1 mg (1 mg Intravenous Given 01/18/23 1606)  ondansetron (ZOFRAN) injection 4 mg (4 mg Intravenous Given 01/18/23 1605)    ED Course/ Medical Decision Making/ A&P   {    Medical Decision Making Patient presents with a history of recurrent syncopal episodes and right-sided pain after syncopal episode on Saturday.  Differentials include constipation, appendicitis, pancreatitis, cholecystitis.  BMP and CBC within normal limits.  CT abdomen revealed large amount of stool in colon.  Patient states that she has been dealing with constipation all of her life, and takes MiraLAX daily at home.  CT abdomen also revealed a cystic right  adnexal lesion with single thin septation, measuring up to 3.6 cm, which could be responsible for her pain.  She was given a dose of Dilaudid, and her pain dissipated. Believe most of her pain could be related to her cyst and constipation, as no other inflammation is present on imaging or laboratory studies. I have placed an OB/GYN referral for her to follow up with, and recommended that she continue taking the miralax.       Final Clinical Impression(s) / ED Diagnoses Final diagnoses:  Right lower quadrant abdominal pain    Rx / DC Orders ED Discharge Orders     None         Tashunda Vandezande, Marlene Lard, MD 01/18/23 2030    Drenda Freeze, MD 01/18/23 2221

## 2023-01-18 NOTE — ED Notes (Signed)
Patient is being discharged from the Urgent Care and sent to the Emergency Department via POV . Per Milus Mallick PA, patient is in need of higher level of care due to flank pain. Patient is aware and verbalizes understanding of plan of care.  Vitals:   01/18/23 1006  BP: 122/80  Pulse: 74  Resp: 16  Temp: 98.5 F (36.9 C)  SpO2: 92%

## 2023-01-18 NOTE — ED Triage Notes (Addendum)
Pt reports right side flank pain x 5 days. Pt states had syncopal episode last Saturday and has been in pain ever since. Pt was seen at Urgent Care pta.

## 2023-01-19 ENCOUNTER — Ambulatory Visit: Payer: Self-pay

## 2023-01-19 NOTE — Telephone Encounter (Signed)
  Chief Complaint: back pain Symptoms: abdominal and back pain on R side, 8/10, comes and goes but worse with movement, no BM in 1 week  Frequency: ongoing for 2 days  Pertinent Negatives: NA Disposition: '[x]'$ ED /'[]'$ Urgent Care (no appt availability in office) / '[]'$ Appointment(In office/virtual)/ '[]'$  [facility]/ '[]'$ Home Care/ '[]'$ Refused Recommended Disposition /'[]'$ [facility]/ '[x]'$  Follow-up with PCP Additional Notes: pt calling to see what she needs to do since was at ED yesterday for fall and sever pain and they just told her she had a cyst on ovary and was constipated. She was given Dilaudid in ED and helped some but pain is just as bad today. Pt has reached out to PCP but no response back yet. I asked if she had tried enema or suppository yet and she has not so advised to try either and  call PCP back to FU and if no relief from enema or suppository to go back to ED. Pt verbalized understanding.   Reason for Disposition  [1] Abdominal pain AND [2] age > 60 years  Answer Assessment - Initial Assessment Questions 1. ONSET: "When did the pain begin?"      Ongoing since fall night before yesterday  2. LOCATION: "Where does it hurt?" (upper, mid or lower back)     R back pain that radiates around under R breast  3. SEVERITY: "How bad is the pain?"  (e.g., Scale 1-10; mild, moderate, or severe)   - MILD (1-3): Doesn't interfere with normal activities.    - MODERATE (4-7): Interferes with normal activities or awakens from sleep.    - SEVERE (8-10): Excruciating pain, unable to do any normal activities.      8/10 4. PATTERN: "Is the pain constant?" (e.g., yes, no; constant, intermittent)      Comes and goes and depends on movement  8. MEDICINES: "What have you taken so far for the pain?" (e.g., nothing, acetaminophen, NSAIDS)     Tylenol  10. OTHER SYMPTOMS: "Do you have any other symptoms?" (e.g., fever, abdomen pain, burning with urination, blood in urine)       Abdominal  pain, no BM since 1 week ago Wednesday  Protocols used: Back Pain-A-AH

## 2023-01-31 ENCOUNTER — Ambulatory Visit
Admission: RE | Admit: 2023-01-31 | Discharge: 2023-01-31 | Disposition: A | Discharge status: Home / Self Care | Payer: Medicare HMO | Source: Ambulatory Visit | Attending: Otolaryngology | Admitting: Otolaryngology

## 2023-01-31 DIAGNOSIS — H9041 Sensorineural hearing loss, unilateral, right ear, with unrestricted hearing on the contralateral side: Secondary | ICD-10-CM

## 2023-01-31 DIAGNOSIS — J342 Deviated nasal septum: Secondary | ICD-10-CM | POA: Diagnosis not present

## 2023-01-31 DIAGNOSIS — J3489 Other specified disorders of nose and nasal sinuses: Secondary | ICD-10-CM | POA: Diagnosis not present

## 2023-01-31 DIAGNOSIS — G319 Degenerative disease of nervous system, unspecified: Secondary | ICD-10-CM | POA: Diagnosis not present

## 2023-01-31 DIAGNOSIS — H9191 Unspecified hearing loss, right ear: Secondary | ICD-10-CM | POA: Diagnosis not present

## 2023-02-09 DIAGNOSIS — Z1231 Encounter for screening mammogram for malignant neoplasm of breast: Secondary | ICD-10-CM | POA: Diagnosis not present

## 2023-02-19 ENCOUNTER — Telehealth: Payer: Self-pay | Admitting: General Practice

## 2023-02-19 ENCOUNTER — Encounter: Payer: Self-pay | Admitting: Obstetrics & Gynecology

## 2023-02-19 ENCOUNTER — Ambulatory Visit (INDEPENDENT_AMBULATORY_CARE_PROVIDER_SITE_OTHER): Payer: Medicare HMO | Admitting: Obstetrics & Gynecology

## 2023-02-19 VITALS — BP 126/77 | HR 93 | Wt 136.0 lb

## 2023-02-19 DIAGNOSIS — N83201 Unspecified ovarian cyst, right side: Secondary | ICD-10-CM | POA: Diagnosis not present

## 2023-02-19 DIAGNOSIS — K5900 Constipation, unspecified: Secondary | ICD-10-CM

## 2023-02-19 NOTE — Progress Notes (Signed)
Patient ID: [name], female   DOB: 1941/10/15, 82 y.o.   MRN: [id number]  Chief Complaint  Patient presents with   Follow-up   ED visit f/u ovarian cyst HPI [name] is a 82 y.o. female.  G1P1001 She fell and had abdominal pain on 1/25 and CT scan was done. Her pain resolved in a few days later and has resolve, and she believes her constipation is better now. 3 cm right ovarian cyst with thin septation was seen and needs f/u. HPI  Past Medical History:  Diagnosis Date   Allergic rhinitis    Arthritis    Cataract    Depression    GERD (gastroesophageal reflux disease)    Hemorrhoids    Hypothyroidism    Lichen planus    Migraine headache    Personal history of colonic adenoma 05/15/2013   Retinal wrinkling     Past Surgical History:  Procedure Laterality Date   CATARACT EXTRACTION     Dr. Zadie Rhine   CHOLECYSTECTOMY  04/04/2012   Procedure: LAPAROSCOPIC CHOLECYSTECTOMY WITH INTRAOPERATIVE CHOLANGIOGRAM;  Surgeon: Zenovia Jarred, MD;  Location: [facility];  Service: General;  Laterality: N/A;   COLONOSCOPY     Retina Wrinkle with surgery  2006, 2013   THYROID SURGERY  15 years ago    Family History  Problem Relation Age of Onset   Coronary artery disease Mother    Coronary artery disease Father    Diabetes Sister    Coronary artery disease Brother    Colon cancer Brother    Diabetes Brother    Stomach cancer Neg Hx    Esophageal cancer Neg Hx    Pancreatic cancer Neg Hx    Liver disease Neg Hx     Social History Social History   Tobacco Use   Smoking status: Never   Smokeless tobacco: Never  Substance Use Topics   Alcohol use: No   Drug use: No    Allergies  Allergen Reactions   Clindamycin    Levofloxacin Nausea And Vomiting    Current Outpatient Medications  Medication Sig Dispense Refill   Biotin 800 MCG TABS Take 800 tablets by mouth daily.     Cholecalciferol (VITAMIN D3) 50 MCG (2000 UT) capsule Take 2,000 Units by mouth daily.      diclofenac Sodium (VOLTAREN) 1 % GEL Apply 4 g topically 3 (three) times daily as needed.     DULoxetine (CYMBALTA) 60 MG capsule 2 capsule     fluticasone (FLONASE) 50 MCG/ACT nasal spray fluticasone propionate 50 mcg/actuation nasal spray,suspension     levothyroxine (SYNTHROID, LEVOTHROID) 112 MCG tablet Take 56 mcg by mouth daily.      Probiotic Product (PROBIOTIC ADVANCED) CAPS Take by mouth daily.     SUMAtriptan (IMITREX) 100 MG tablet Take 1 tablet as needed for migraines     Budeson-Glycopyrrol-Formoterol (BREZTRI AEROSPHERE) 160-9-4.8 MCG/ACT AERO Inhale 2 puffs into the lungs 2 (two) times daily. (Patient not taking: Reported on 02/19/2023)     clotrimazole (MYCELEX) 10 MG troche Take 10 mg by mouth as needed. (Patient not taking: Reported on 02/19/2023)     loratadine (CLARITIN) 10 MG tablet Take 10 mg by mouth daily. (Patient not taking: Reported on 02/19/2023)     pantoprazole (PROTONIX) 40 MG tablet Take 40 mg by mouth. (Patient not taking: Reported on 02/19/2023)     No current facility-administered medications for this visit.    Review of Systems Review of Systems  Constitutional: Negative.  HENT: Negative.    Eyes: Negative.   Respiratory: Negative.    Gastrointestinal: Negative.   Genitourinary:  Negative for pelvic pain, vaginal bleeding and vaginal discharge.    Blood pressure 126/77, pulse 93, weight 61.7 kg.  Physical Exam Physical Exam Vitals and nursing note reviewed.  Constitutional:      Appearance: Normal appearance.  Cardiovascular:     Rate and Rhythm: Normal rate.  Pulmonary:     Effort: Pulmonary effort is normal.  Abdominal:     General: Abdomen is flat. There is no distension.     Palpations: Abdomen is soft.     Tenderness: There is no abdominal tenderness.  Skin:    General: Skin is warm and dry.  Neurological:     Mental Status: She is alert.  Psychiatric:        Mood and Affect: Mood normal.        Behavior: Behavior normal.     Data  Reviewed LINICAL DATA:  Abdominal pain, acute, nonlocalized. Right-sided flank pain x5 days.   EXAM: CT ABDOMEN AND PELVIS WITH CONTRAST   TECHNIQUE: Multidetector CT imaging of the abdomen and pelvis was performed using the standard protocol following bolus administration of intravenous contrast.   RADIATION DOSE REDUCTION: This exam was performed according to the departmental dose-optimization program which includes automated exposure control, adjustment of the mA and/or kV according to patient size and/or use of iterative reconstruction technique.   CONTRAST:  160m OMNIPAQUE IOHEXOL 300 MG/ML  SOLN   COMPARISON:  CT abdomen/pelvis 05/01/2013.   FINDINGS: Lower chest: Linear atelectasis in the lung bases. Trace right pleural effusion. Normal heart size. No pericardial effusion.   Hepatobiliary: Unchanged 1.0 cm hypoenhancing lesion in liver segment 2, likely hemangioma. Focal fat along the falciform ligament. Prior cholecystectomy. No biliary dilatation.   Pancreas: Unremarkable. No pancreatic ductal dilatation or surrounding inflammatory changes.   Spleen: Normal.   Adrenals/Urinary Tract: Normal adrenals. Kidneys are unremarkable. No calculi or hydronephrosis. Underdistended bladder.   Stomach/Bowel: Normal stomach and duodenum. No dilated loops of small bowel. Normal appendix is visualized on image 67 series 3. Large burden of stool in the right colon and hepatic flexure. Redundant transverse colon. Decompressed sigmoid colon and rectum. Mild sigmoid diverticulosis without evidence of acute diverticulitis.   Vascular/Lymphatic: Atherosclerotic calcifications of the abdominal aorta and its branches. Mild mesenteric lymphadenopathy in the left lower quadrant with surrounding fat stranding and the dominant node measuring up to 17 x 14 mm on image 51 of series 3.   Reproductive: Cystic right adnexal lesion with single thin septation, measuring up to 3.6 x 2.1 cm.    Other: No abdominal wall hernia or abnormality. No abdominopelvic ascites.   Musculoskeletal: Right-greater-than-left hip degeneration. Lumbar spondylosis without high-grade spinal canal stenosis. No suspicious bone lesions.   IMPRESSION: 1. Large burden of stool in the right colon and hepatic flexure. Correlate for constipation. 2. Mild mesenteric lymphadenopathy in the left lower quadrant with surrounding fat stranding. Findings are nonspecific but can be seen in the setting of mesenteric adenitis. 3. Cystic right adnexal lesion with single thin septation, measuring up to 3.6 cm. Recommend further characterization with pelvic ultrasound. Reference: JACR 2020 Feb; 17(2):248-254   Aortic Atherosclerosis (ICD10-I70.0).     Electronically Signed   By: WEmmit AlexandersM.D.   On: 01/18/2023 14:56    Assessment S/p abdominal pain and constipation  Cyst of right ovary - Plan: UK[place]PELVIC COMPLETE WITH TRANSVAGINAL, CA 125  Constipation, unspecified constipation type  Plan W/u includes f/u US and CA 125 today  If normal I would consider her low risk for ovarian neoplasm and would need no other f/u.  Emeterio Reeve 02/19/2023, 8:43 AM

## 2023-02-19 NOTE — Telephone Encounter (Signed)
Patient called and left message on nurse voicemail line stating she has questions about something she saw on her summary sheet.

## 2023-02-20 LAB — CA 125: Cancer Antigen (CA) 125: 11.1 U/mL (ref 0.0–38.1)

## 2023-02-21 NOTE — Telephone Encounter (Signed)
I called patient back and she asked what is the appointment for tomorrow and does she need to keep it. I explained is follow up US from results seen on CT scan - to see if cystic structure has changed. She voices understanding and thanked me for calling her back. [name]

## 2023-02-22 ENCOUNTER — Other Ambulatory Visit: Payer: Self-pay | Admitting: Obstetrics & Gynecology

## 2023-02-22 ENCOUNTER — Ambulatory Visit
Admission: RE | Admit: 2023-02-22 | Discharge: 2023-02-22 | Disposition: A | Discharge status: Home / Self Care | Payer: Medicare HMO | Source: Ambulatory Visit | Attending: Obstetrics & Gynecology | Admitting: Obstetrics & Gynecology

## 2023-02-22 DIAGNOSIS — N83201 Unspecified ovarian cyst, right side: Secondary | ICD-10-CM

## 2023-02-22 DIAGNOSIS — K5900 Constipation, unspecified: Secondary | ICD-10-CM

## 2023-03-13 DIAGNOSIS — L603 Nail dystrophy: Secondary | ICD-10-CM | POA: Diagnosis not present

## 2023-03-13 DIAGNOSIS — L821 Other seborrheic keratosis: Secondary | ICD-10-CM | POA: Diagnosis not present

## 2023-03-13 DIAGNOSIS — D2272 Melanocytic nevi of left lower limb, including hip: Secondary | ICD-10-CM | POA: Diagnosis not present

## 2023-03-13 DIAGNOSIS — L700 Acne vulgaris: Secondary | ICD-10-CM | POA: Diagnosis not present

## 2023-03-13 DIAGNOSIS — L820 Inflamed seborrheic keratosis: Secondary | ICD-10-CM | POA: Diagnosis not present

## 2023-03-13 DIAGNOSIS — D2261 Melanocytic nevi of right upper limb, including shoulder: Secondary | ICD-10-CM | POA: Diagnosis not present

## 2023-03-13 DIAGNOSIS — D225 Melanocytic nevi of trunk: Secondary | ICD-10-CM | POA: Diagnosis not present

## 2023-03-13 DIAGNOSIS — L738 Other specified follicular disorders: Secondary | ICD-10-CM | POA: Diagnosis not present

## 2023-03-21 DIAGNOSIS — H35373 Puckering of macula, bilateral: Secondary | ICD-10-CM | POA: Diagnosis not present

## 2023-03-21 DIAGNOSIS — Z961 Presence of intraocular lens: Secondary | ICD-10-CM | POA: Diagnosis not present

## 2023-03-21 DIAGNOSIS — H52203 Unspecified astigmatism, bilateral: Secondary | ICD-10-CM | POA: Diagnosis not present

## 2023-05-07 DIAGNOSIS — R7989 Other specified abnormal findings of blood chemistry: Secondary | ICD-10-CM | POA: Diagnosis not present

## 2023-05-07 DIAGNOSIS — K219 Gastro-esophageal reflux disease without esophagitis: Secondary | ICD-10-CM | POA: Diagnosis not present

## 2023-05-07 DIAGNOSIS — E039 Hypothyroidism, unspecified: Secondary | ICD-10-CM | POA: Diagnosis not present

## 2023-05-07 DIAGNOSIS — E785 Hyperlipidemia, unspecified: Secondary | ICD-10-CM | POA: Diagnosis not present

## 2023-05-14 DIAGNOSIS — Z1339 Encounter for screening examination for other mental health and behavioral disorders: Secondary | ICD-10-CM | POA: Diagnosis not present

## 2023-05-14 DIAGNOSIS — Z1331 Encounter for screening for depression: Secondary | ICD-10-CM | POA: Diagnosis not present

## 2023-05-14 DIAGNOSIS — J441 Chronic obstructive pulmonary disease with (acute) exacerbation: Secondary | ICD-10-CM | POA: Diagnosis not present

## 2023-05-14 DIAGNOSIS — H9191 Unspecified hearing loss, right ear: Secondary | ICD-10-CM | POA: Diagnosis not present

## 2023-05-14 DIAGNOSIS — F39 Unspecified mood [affective] disorder: Secondary | ICD-10-CM | POA: Diagnosis not present

## 2023-05-14 DIAGNOSIS — R82998 Other abnormal findings in urine: Secondary | ICD-10-CM | POA: Diagnosis not present

## 2023-05-14 DIAGNOSIS — R030 Elevated blood-pressure reading, without diagnosis of hypertension: Secondary | ICD-10-CM | POA: Diagnosis not present

## 2023-05-14 DIAGNOSIS — Z0000 Encounter for general adult medical examination without abnormal findings: Secondary | ICD-10-CM | POA: Diagnosis not present

## 2023-05-14 DIAGNOSIS — G629 Polyneuropathy, unspecified: Secondary | ICD-10-CM | POA: Diagnosis not present

## 2023-05-14 DIAGNOSIS — N1831 Chronic kidney disease, stage 3a: Secondary | ICD-10-CM | POA: Diagnosis not present

## 2023-05-14 DIAGNOSIS — E785 Hyperlipidemia, unspecified: Secondary | ICD-10-CM | POA: Diagnosis not present

## 2023-06-25 DIAGNOSIS — H6121 Impacted cerumen, right ear: Secondary | ICD-10-CM | POA: Diagnosis not present

## 2023-06-25 DIAGNOSIS — H906 Mixed conductive and sensorineural hearing loss, bilateral: Secondary | ICD-10-CM | POA: Diagnosis not present

## 2023-08-08 DIAGNOSIS — L245 Irritant contact dermatitis due to other chemical products: Secondary | ICD-10-CM | POA: Diagnosis not present

## 2023-08-08 DIAGNOSIS — L309 Dermatitis, unspecified: Secondary | ICD-10-CM | POA: Diagnosis not present

## 2023-08-08 DIAGNOSIS — L603 Nail dystrophy: Secondary | ICD-10-CM | POA: Diagnosis not present

## 2023-08-13 DIAGNOSIS — N1831 Chronic kidney disease, stage 3a: Secondary | ICD-10-CM | POA: Diagnosis not present

## 2023-08-13 DIAGNOSIS — F39 Unspecified mood [affective] disorder: Secondary | ICD-10-CM | POA: Diagnosis not present

## 2023-08-13 DIAGNOSIS — R030 Elevated blood-pressure reading, without diagnosis of hypertension: Secondary | ICD-10-CM | POA: Diagnosis not present

## 2023-08-13 DIAGNOSIS — R296 Repeated falls: Secondary | ICD-10-CM | POA: Diagnosis not present

## 2023-08-13 DIAGNOSIS — H9191 Unspecified hearing loss, right ear: Secondary | ICD-10-CM | POA: Diagnosis not present

## 2023-08-13 DIAGNOSIS — G629 Polyneuropathy, unspecified: Secondary | ICD-10-CM | POA: Diagnosis not present

## 2023-08-13 DIAGNOSIS — E785 Hyperlipidemia, unspecified: Secondary | ICD-10-CM | POA: Diagnosis not present

## 2023-08-13 DIAGNOSIS — E039 Hypothyroidism, unspecified: Secondary | ICD-10-CM | POA: Diagnosis not present

## 2023-10-11 ENCOUNTER — Ambulatory Visit: Payer: Medicare HMO | Admitting: Internal Medicine

## 2023-10-23 DIAGNOSIS — H532 Diplopia: Secondary | ICD-10-CM | POA: Diagnosis not present

## 2023-11-06 DIAGNOSIS — H532 Diplopia: Secondary | ICD-10-CM | POA: Diagnosis not present

## 2023-11-16 DIAGNOSIS — H9191 Unspecified hearing loss, right ear: Secondary | ICD-10-CM | POA: Diagnosis not present

## 2023-11-16 DIAGNOSIS — F39 Unspecified mood [affective] disorder: Secondary | ICD-10-CM | POA: Diagnosis not present

## 2023-11-16 DIAGNOSIS — E039 Hypothyroidism, unspecified: Secondary | ICD-10-CM | POA: Diagnosis not present

## 2023-11-16 DIAGNOSIS — H543 Unqualified visual loss, both eyes: Secondary | ICD-10-CM | POA: Diagnosis not present

## 2023-11-16 DIAGNOSIS — E785 Hyperlipidemia, unspecified: Secondary | ICD-10-CM | POA: Diagnosis not present

## 2023-11-16 DIAGNOSIS — N1831 Chronic kidney disease, stage 3a: Secondary | ICD-10-CM | POA: Diagnosis not present

## 2023-11-16 DIAGNOSIS — Z23 Encounter for immunization: Secondary | ICD-10-CM | POA: Diagnosis not present

## 2023-11-16 DIAGNOSIS — J441 Chronic obstructive pulmonary disease with (acute) exacerbation: Secondary | ICD-10-CM | POA: Diagnosis not present

## 2023-11-19 DIAGNOSIS — H6123 Impacted cerumen, bilateral: Secondary | ICD-10-CM | POA: Diagnosis not present

## 2023-11-19 DIAGNOSIS — H6061 Unspecified chronic otitis externa, right ear: Secondary | ICD-10-CM | POA: Diagnosis not present

## 2023-11-20 DIAGNOSIS — H532 Diplopia: Secondary | ICD-10-CM | POA: Diagnosis not present

## 2023-11-20 DIAGNOSIS — H5000 Unspecified esotropia: Secondary | ICD-10-CM | POA: Diagnosis not present

## 2024-09-09 ENCOUNTER — Encounter: Admitting: Pulmonary Disease

## 2024-09-09 ENCOUNTER — Telehealth: Payer: Self-pay | Admitting: Pulmonary Disease

## 2024-11-13 ENCOUNTER — Emergency Department (HOSPITAL_COMMUNITY)

## 2024-11-13 ENCOUNTER — Emergency Department (HOSPITAL_COMMUNITY): Admission: EM | Admit: 2024-11-13 | Discharge: 2024-11-13 | Disposition: A | Discharge status: Home / Self Care

## 2024-11-13 ENCOUNTER — Encounter (HOSPITAL_COMMUNITY): Payer: Self-pay

## 2024-11-13 ENCOUNTER — Other Ambulatory Visit: Payer: Self-pay

## 2024-11-13 DIAGNOSIS — M4312 Spondylolisthesis, cervical region: Secondary | ICD-10-CM | POA: Diagnosis not present

## 2024-11-13 DIAGNOSIS — Y92481 Parking lot as the place of occurrence of the external cause: Secondary | ICD-10-CM | POA: Insufficient documentation

## 2024-11-13 DIAGNOSIS — R410 Disorientation, unspecified: Secondary | ICD-10-CM | POA: Insufficient documentation

## 2024-11-13 DIAGNOSIS — S0081XA Abrasion of other part of head, initial encounter: Secondary | ICD-10-CM | POA: Diagnosis not present

## 2024-11-13 DIAGNOSIS — W19XXXA Unspecified fall, initial encounter: Secondary | ICD-10-CM | POA: Insufficient documentation

## 2024-11-13 DIAGNOSIS — S0990XA Unspecified injury of head, initial encounter: Secondary | ICD-10-CM | POA: Diagnosis present

## 2024-11-13 DIAGNOSIS — R55 Syncope and collapse: Secondary | ICD-10-CM | POA: Diagnosis not present

## 2024-11-13 DIAGNOSIS — R4020 Unspecified coma: Secondary | ICD-10-CM

## 2024-11-13 LAB — COMPREHENSIVE METABOLIC PANEL WITH GFR
ALT: 18 U/L (ref 0–44)
AST: 29 U/L (ref 15–41)
Albumin: 3.4 g/dL — ABNORMAL LOW (ref 3.5–5.0)
Alkaline Phosphatase: 60 U/L (ref 38–126)
Anion gap: 10 (ref 5–15)
BUN: 17 mg/dL (ref 8–23)
CO2: 26 mmol/L (ref 22–32)
Calcium: 8.8 mg/dL — ABNORMAL LOW (ref 8.9–10.3)
Chloride: 98 mmol/L (ref 98–111)
Creatinine, Ser: 0.86 mg/dL (ref 0.44–1.00)
GFR, Estimated: >60 mL/min (ref >60)
Glucose, Bld: 107 mg/dL — ABNORMAL HIGH (ref 70–99)
Potassium: 4.1 mmol/L (ref 3.5–5.1)
Sodium: 134 mmol/L — ABNORMAL LOW (ref 135–145)
Total Bilirubin: 0.6 mg/dL (ref 0.0–1.2)
Total Protein: 6.1 g/dL — ABNORMAL LOW (ref 6.5–8.1)

## 2024-11-13 LAB — URINALYSIS, ROUTINE W REFLEX MICROSCOPIC
Bacteria, UA: NONE SEEN
Bilirubin Urine: NEGATIVE
Glucose, UA: NEGATIVE mg/dL
Hgb urine dipstick: NEGATIVE
Ketones, ur: NEGATIVE mg/dL
Nitrite: NEGATIVE
Protein, ur: NEGATIVE mg/dL
Specific Gravity, Urine: 1.01 (ref 1.005–1.030)
pH: 5 (ref 5.0–8.0)

## 2024-11-13 LAB — CBC WITH DIFFERENTIAL/PLATELET
Abs Immature Granulocytes: 0.02 K/uL (ref 0.00–0.07)
Basophils Absolute: 0 K/uL (ref 0.0–0.1)
Basophils Relative: 0 %
Eosinophils Absolute: 0.1 K/uL (ref 0.0–0.5)
Eosinophils Relative: 2 %
HCT: 39.4 % (ref 36.0–46.0)
Hemoglobin: 12.6 g/dL (ref 12.0–15.0)
Immature Granulocytes: 0 %
Lymphocytes Relative: 31 %
Lymphs Abs: 2.2 K/uL (ref 0.7–4.0)
MCH: 31.6 pg (ref 26.0–34.0)
MCHC: 32 g/dL (ref 30.0–36.0)
MCV: 98.7 fL (ref 80.0–100.0)
Monocytes Absolute: 0.6 K/uL (ref 0.1–1.0)
Monocytes Relative: 8 %
Neutro Abs: 4.2 K/uL (ref 1.7–7.7)
Neutrophils Relative %: 59 %
Platelets: 231 K/uL (ref 150–400)
RBC: 3.99 MIL/uL (ref 3.87–5.11)
RDW: 12.7 % (ref 11.5–15.5)
WBC: 7.1 K/uL (ref 4.0–10.5)
nRBC: 0 % (ref 0.0–0.2)

## 2024-11-13 LAB — TSH: TSH: 3.044 u[IU]/mL (ref 0.350–4.500)

## 2024-11-13 LAB — TROPONIN I (HIGH SENSITIVITY)
Troponin I (High Sensitivity): 4 ng/L (ref <18)
Troponin I (High Sensitivity): 4 ng/L (ref <18)

## 2024-11-13 LAB — CBG MONITORING, ED: Glucose-Capillary: 113 mg/dL — ABNORMAL HIGH (ref 70–99)

## 2024-11-13 LAB — MAGNESIUM: Magnesium: 2.1 mg/dL (ref 1.7–2.4)

## 2024-11-13 NOTE — ED Provider Notes (Signed)
 [place] [facility] Provider Note   CSN: [id number] Arrival date & time: 11/13/24  8371     Patient presents with: [name] is a 83 y.o. female.    Fall Pertinent negatives include no chest pain, no abdominal pain and no shortness of breath.    Patient presents because of fall.  Unwitnessed fall.  Happened whenever she was going shopping.  Patient does not remember what happened immediately before or immediately after.  She is not sure what she remembers first after waking up.  Patient states that she endorses a slight headache.  No neck or back pain.  No numbness or tingling anywhere.  Patient states that she had a slight pain in the right side of her chest earlier that subsequently resolved.  No chest pain before or after that she can really remember.  No recent exertional chest pain.  No shortness of breath.  Patient otherwise denies all complaints at this time.  No vision changes.  No diplopia.  No numbness or tingling of her face.  Family at bedside states that she is acting slightly confused but otherwise do not appreciate any kind of new focal derangements.   MS responded to the patient.  They will in the shopping center parking lot.  Sounds like she may have fell out of her car face first unclear.  Previous medical history reviewed : Patient last seen in the ED in January 2024.  Flank pain.  Large amount of stool in the colon.  Constipation.  Right adnexal lesion as well.   Prior to Admission medications   Medication Sig Start Date End Date Taking? Authorizing Provider  Biotin 800 MCG TABS Take 800 tablets by mouth daily.    [provider]  Budeson-Glycopyrrol-Formoterol (BREZTRI AEROSPHERE) 160-9-4.8 MCG/ACT AERO Inhale 2 puffs into the lungs 2 (two) times daily. Patient not taking: Reported on 02/19/2023    [provider]  Cholecalciferol (VITAMIN D3) 50 MCG (2000 UT) capsule Take 2,000 Units by mouth  daily.    [provider]  clotrimazole (MYCELEX) 10 MG troche Take 10 mg by mouth as needed. Patient not taking: Reported on 02/19/2023    [provider]  diclofenac Sodium (VOLTAREN) 1 % GEL Apply 4 g topically 3 (three) times daily as needed.    [provider]  DULoxetine (CYMBALTA) 60 MG capsule 2 capsule    [provider]  fluticasone (FLONASE) 50 MCG/ACT nasal spray fluticasone propionate 50 mcg/actuation nasal spray,suspension    [provider]  levothyroxine  (SYNTHROID , LEVOTHROID) 112 MCG tablet Take 56 mcg by mouth daily.     [provider]  loratadine (CLARITIN) 10 MG tablet Take 10 mg by mouth daily. Patient not taking: Reported on 02/19/2023 11/07/19   [provider]  pantoprazole  (PROTONIX ) 40 MG tablet Take 40 mg by mouth. Patient not taking: Reported on 02/19/2023    [provider]  Probiotic Product (PROBIOTIC ADVANCED) CAPS Take by mouth daily.    [provider]  SUMAtriptan (IMITREX) 100 MG tablet Take 1 tablet as needed for migraines 12/04/17   [provider]    Allergies: Clindamycin and Levofloxacin    Review of Systems  Constitutional:  Negative for chills and fever.  HENT:  Negative for ear pain and sore throat.   Eyes:  Negative for pain and visual disturbance.  Respiratory:  Negative for cough and shortness of breath.   Cardiovascular:  Negative for chest pain  and palpitations.  Gastrointestinal:  Negative for abdominal pain and vomiting.  Genitourinary:  Negative for dysuria and hematuria.  Musculoskeletal:  Negative for arthralgias and back pain.  Skin:  Negative for color change and rash.  Neurological:  Negative for seizures and syncope.  All other systems reviewed and are negative.   Updated Vital Signs BP (!) 145/92   Pulse 81   Temp 98.4 F (36.9 C) (Oral)   Resp 15   Ht 5' 4 (1.626 m)   Wt 67.6 kg   SpO2 100%   BMI 25.58 kg/m   Physical  Exam Vitals and nursing note reviewed.  Constitutional:      General: She is not in acute distress.    Appearance: She is well-developed.  HENT:     Head: Normocephalic and atraumatic.  Eyes:     Conjunctiva/sclera: Conjunctivae normal.  Cardiovascular:     Rate and Rhythm: Normal rate and regular rhythm.     Heart sounds: No murmur heard. Pulmonary:     Effort: Pulmonary effort is normal. No respiratory distress.     Breath sounds: Normal breath sounds.  Abdominal:     Palpations: Abdomen is soft.     Tenderness: There is no abdominal tenderness.  Musculoskeletal:        General: No swelling.     Cervical back: Neck supple.  Skin:    General: Skin is warm and dry.     Capillary Refill: Capillary refill takes less than 2 seconds.  Neurological:     Mental Status: She is alert.  Psychiatric:        Mood and Affect: Mood normal.     (all labs ordered are listed, but only abnormal results are displayed) Labs Reviewed  COMPREHENSIVE METABOLIC PANEL WITH GFR - Abnormal; Notable for the following components:      Result Value   Sodium 134 (*)    Glucose, Bld 107 (*)    Calcium 8.8 (*)    Total Protein 6.1 (*)    Albumin 3.4 (*)    All other components within normal limits  URINALYSIS, ROUTINE W REFLEX MICROSCOPIC - Abnormal; Notable for the following components:   APPearance HAZY (*)    Leukocytes,Ua SMALL (*)    All other components within normal limits  CBG MONITORING, ED - Abnormal; Notable for the following components:   Glucose-Capillary 113 (*)    All other components within normal limits  CBC WITH DIFFERENTIAL/PLATELET  MAGNESIUM  TSH  TROPONIN I (HIGH SENSITIVITY)  TROPONIN I (HIGH SENSITIVITY)    EKG: EKG Interpretation Date/Time:  Thursday November 13 2024 16:33:30 EST Ventricular Rate:  76 PR Interval:  160 QRS Duration:  89 QT Interval:  409 QTC Calculation: 460 R Axis:   -10  Text Interpretation: Sinus rhythm Low voltage, precordial leads  Confirmed by Simon Rea [phone]) on 11/13/2024 4:42:45 PM  Radiology: CT Cervical Spine Wo Contrast Result Date: 11/13/2024 EXAM: CT CERVICAL SPINE WITHOUT CONTRAST 11/13/2024 06:05:08 PM TECHNIQUE: CT of the cervical spine was performed without the administration of intravenous contrast. Multiplanar reformatted images are provided for review. Automated exposure control, iterative reconstruction, and/or weight based adjustment of the mA/kV was utilized to reduce the radiation dose to as low as reasonably achievable. COMPARISON: X-ray of cervical spine 11/12/2005 CLINICAL HISTORY: blunt trauma FINDINGS: CERVICAL SPINE: BONES AND ALIGNMENT: Grade 1 anterolisthesis of C3 on C4 and C4 on C5. No acute fracture or traumatic malalignment. DEGENERATIVE CHANGES: Severe degenerative changes of the C5-C7 levels. No  associated severe osseous, neural foraminal, or central canal stenosis. SOFT TISSUES: No prevertebral soft tissue swelling. VASCULATURE: Atherosclerotic plaque of the aorta. IMPRESSION: 1. No acute abnormality of the cervical spine, 2. Grade 1 anterolisthesis of C3 on C4 and C4 on C5. Electronically signed by: Morgane Naveau MD 11/13/2024 06:23 PM EST RP Workstation: [id number]   DG Chest Portable 1 View Result Date: 11/13/2024 EXAM: 1 VIEW(S) XRAY OF THE CHEST 11/13/2024 06:08:00 PM COMPARISON: CXR 04/05/2015, CT CHEST 01/25/2022. CLINICAL HISTORY: CP FINDINGS: LUNGS AND PLEURA: Biapical pleural/pulmonary scarring. Left base atelectasis. No pleural effusion. No pneumothorax. HEART AND MEDIASTINUM: Aortic calcification. No acute abnormality of the cardiac and mediastinal silhouettes. BONES AND SOFT TISSUES: Old healed right rib fractures. No acute osseous abnormality. IMPRESSION: 1. No acute findings. Electronically signed by: Morgane Naveau MD 11/13/2024 06:20 PM EST RP Workstation: [id number]   CT Head Wo Contrast Result Date: 11/13/2024 EXAM: CT HEAD WITHOUT CONTRAST 11/13/2024 06:05:08 PM  TECHNIQUE: CT of the head was performed without the administration of intravenous contrast. Automated exposure control, iterative reconstruction, and/or weight based adjustment of the mA/kV was utilized to reduce the radiation dose to as low as reasonably achievable. COMPARISON: CT head 11/29/2023. CLINICAL HISTORY: blunt trauma FINDINGS: BRAIN AND VENTRICLES: No acute hemorrhage. No evidence of acute infarct. Patchy and confluent decreased attenuation throughout deep and periventricular white matter of cerebral hemispheres bilaterally, compatible with chronic microvascular ischemic disease. Redemonstration of CSF density right parietal extra-axial lesion measuring up to 5 cm. Prominence of the lateral ventricles may be related to central predominant atrophy, although a component of normal pressure/communicating hydrocephalus cannot be excluded. No mass effect or midline shift. ORBITS: Bilateral lens replacement noted. SINUSES: No acute abnormality. SOFT TISSUES AND SKULL: No acute soft tissue abnormality. No skull fracture. IMPRESSION: 1. No acute intracranial abnormality. 2. Prominent lateral ventricles, possibly related to central predominant atrophy or normal pressure/communicating hydrocephalus. Electronically signed by: Morgane Naveau MD 11/13/2024 06:19 PM EST RP Workstation: [id number]     Procedures   Medications Ordered in the ED - No data to display                                  Medical Decision Making Amount and/or Complexity of Data Reviewed Labs: ordered. Radiology: ordered.     HPI:   Patient presents because of fall.  Unwitnessed fall.  Happened whenever she was going shopping.  Patient does not remember what happened immediately before or immediately after.  She is not sure what she remembers first after waking up.  Patient states that she endorses a slight headache.  No neck or back pain.  No numbness or tingling anywhere.  Patient states that she had a slight pain in the  right side of her chest earlier that subsequently resolved.  No chest pain before or after that she can really remember.  No recent exertional chest pain.  No shortness of breath.  Patient otherwise denies all complaints at this time.  No vision changes.  No diplopia.  No numbness or tingling of her face.  Family at bedside states that she is acting slightly confused but otherwise do not appreciate any kind of new focal derangements.   MS responded to the patient.  They will in the shopping center parking lot.  Sounds like she may have fell out of her car face first unclear.  Previous medical history reviewed : Patient last seen in the ED in  January 2024.  Flank pain.  Large amount of stool in the colon.  Constipation.  Right adnexal lesion as well.   MDM:   Upon exam, patient alert to self, place as well as month.  No focal deficits I can appreciate.  Cranials 2-12 appear intact.  Strength and sensation intact in all extremities.  No facial droop.  No diplopia.  No concerns for acute CVA at this point in time.  Patient has some abrasions to the face but no concerns for count of facial fracture.  No concerns for any kind of entrapment of the eye.  Patient has no pain to cervical spine but given patient's age and mechanism, will obtain CT head as well as CT C-spine.  Negative thoracic and lumbar pain to palpation.  No occasion to obtain imaging.  No concerns for compression fracture at this area.  Will obtain EKG and keep patient on cardiac telemetry.  Obtain electrolytes to make sure there is no Electra derangements that could predispose patient to arrhythmia.  Will obtain cardiac enzymes as well.  Patient states that earlier when she first got here she noticed a twinge in the right side of her chest.  Do not sound ACS at all but given unexplained event today, will obtain EKG and troponin to rule out any kind ACS event  Reevaluation:   Upon reexamination, patient hemodynamically stable.  Remains  A&O x 3 with GCS 15.  CT head and CT C-spine unremarkable.  No acute pathology was seen.  Laboratory workup unremarkable.  No acute pathology seen.  Cardiac enzymes x 2 unremarkable.  Patient remained in cardiac telemetry without Account of arrhythmia.   Discussed with the patient further as well as with the nursing staff, it sounds like patient was probably try to get out of the car and subsequently fell while getting of the car and subsequent struck her head and lost consciousness.  Does not sound like she was walking or had any kind of sudden syncope or collapse but hard to say given that was unwitnessed.  When speaking with the patient, she has had a couple falls in the past.  Most of the fall was a week or 2 ago whenever she just tripped.  Had a previous fall a couple months ago whenever she got up to go to the bathroom and she woke up on the whole floor.  Unclear etiology of this.  Could be orthostatic syncope at that time versus other syncope.  Nevertheless, I did recommend patient stay in the hospital for cardiac telemetry overnight as well as echo in the morning.  Patient refused.  She understands risk of going home.  Subsequently did refer patient to cardiology outpatient for Holter monitoring to assess for any kind of arrhythmias.  She has had no arrhythmias that she has been here.  She understands the risk of going home at this time . Family at bedside and will go home as well.   EKG Interpreted by Me: sinus    Cardiac Tele Interpreted by Me: sinus    I have independently interpreted the CXR  and CT  images and agree with the radiologist finding   Social Determinant of Health: lives at home with husband    Disposition and Follow Up: cardiology       Final diagnoses:  Fall, initial encounter  Loss of consciousness [facility])    ED Discharge Orders          Ordered    Ambulatory referral to Cardiology  Comments: If you have not heard from the Cardiology office within  the next 72 hours please call [phone].   11/13/24 1942               Simon Lavonia SAILOR, MD 11/13/24 1946

## 2024-11-13 NOTE — ED Triage Notes (Signed)
 Pt BIB EMS from shopping center parking after fall out of the car face first onto pavement. Pt unable to recall events and personal information outside of name and birthday. Family reports pt is usually Aox4. Pt is not on thinners. Pt has laceration and swelling to left side of face. Pt arrives in c-collar placed by EMS.   EMS Vitals BP 140/88 HR 86 CBG 117

## 2024-11-13 NOTE — Discharge Instructions (Signed)
 As we discussed, please follow-up with cardiology.  They do want to perform a Holter monitor to make sure you not have any kind of arrhythmia given your falls.  As we discussed, I wanted to keep you here in the hospital for further workup he elected to go home.  Please note, this increases your risk of further medical event and/or death.  If you have any kind of syncope or loss of consciousness episodes please come back to ED immediately  Otherwise, your workup today was unremarkable for acute pathology

## 2024-11-13 NOTE — ED Notes (Signed)
 Pt ambulated down hall with RN to bathroom and back to room. Pt tolerated well and no distress noted at this time.

## 2024-12-22 ENCOUNTER — Ambulatory Visit: Payer: Self-pay

## 2024-12-22 NOTE — Telephone Encounter (Signed)
 NOT AN ESTABLISHED [facility] PATIENT  Called Nurse Triage reporting Dizziness.  Symptoms began several days ago.  Interventions attempted: Nothing.  Symptoms are: unchanged.  Triage Disposition: See Physician Within 24 Hours  Patient/caregiver understands and will follow disposition?: No, refuses disposition   Copied from CRM #[id number]. Topic: Clinical - Red Word Triage >> Dec 22, 2024  3:51 PM Deaijah H wrote: Red Word that prompted transfer to Nurse Triage: Congested and dizzy / establish care with Reuben Burkes   ----------------------------------------------------------------------- From previous Reason for Contact - Scheduling: Patient/patient representative is calling to schedule an appointment. Refer to attachments for appointment information. Reason for Disposition  [1] MODERATE dizziness (e.g., interferes with normal activities) AND [2] has NOT been evaluated by doctor (or NP/PA) for this  (Exception: Dizziness caused by heat exposure, sudden standing, or poor fluid intake.)  Answer Assessment - Initial Assessment Questions 1. DESCRIPTION: Describe your dizziness.     Per husband of pt she feels like she is spinning  2. LIGHTHEADED: Do you feel lightheaded? (e.g., somewhat faint, woozy, weak upon standing)     When I get up and move around per patient  3. VERTIGO: Do you feel like either you or the room is spinning or tilting? (i.e., vertigo)     She feels like she is spinning  4. SEVERITY: How bad is it?  Do you feel like you are going to faint? Can you stand and walk?     Per husband the pt can walk but stumbles  5. ONSET:  When did the dizziness begin?     A few days  6. AGGRAVATING FACTORS: Does anything make it worse? (e.g., standing, change in head position)     Getting up and moving  8. CAUSE: What do you think is causing the dizziness? (e.g., decreased fluids or food, diarrhea, emotional distress, heat exposure, new medicine, sudden  standing, vomiting; unknown)     Pt unsure  9. RECURRENT SYMPTOM: Have you had dizziness before? If Yes, ask: When was the last time? What happened that time?     Denies  10. OTHER SYMPTOMS: Do you have any other symptoms? (e.g., fever, chest pain, vomiting, diarrhea, bleeding) Denies  Husband of pt is the main historian and notably agitated, stating hurry up in a frustrated tone multiple times during the phone call. Husband of pt also noted to be a poor historian. He was demanding an appointment today, and was informed no appointments are available, and also that the pt is unestablished with [facility]. When he was advised to take his wife to an UC for her dizziness, he refused and stated he will call the doctor tomorrow. Husband of pt then stated I'm tired of you not being helpful and ended the call.  Protocols used: Dizziness - Lightheadedness-A-AH

## 2025-03-12 ENCOUNTER — Ambulatory Visit: Admitting: Internal Medicine
